# Patient Record
Sex: Female | Born: 1995 | Race: White | Hispanic: No | Marital: Single | Smoking: Former smoker
Health system: Southern US, Community
[De-identification: ages and names within clinical notes are randomized; demographics above are authoritative.]

## PROBLEM LIST (undated history)

## (undated) DIAGNOSIS — R519 Headache, unspecified: Secondary | ICD-10-CM

## (undated) DIAGNOSIS — U071 COVID-19: Secondary | ICD-10-CM

## (undated) DIAGNOSIS — N926 Irregular menstruation, unspecified: Secondary | ICD-10-CM

## (undated) DIAGNOSIS — R011 Cardiac murmur, unspecified: Secondary | ICD-10-CM

## (undated) DIAGNOSIS — Z8759 Personal history of other complications of pregnancy, childbirth and the puerperium: Secondary | ICD-10-CM

## (undated) DIAGNOSIS — R51 Headache: Secondary | ICD-10-CM

## (undated) HISTORY — DX: Headache, unspecified: R51.9

## (undated) HISTORY — DX: Irregular menstruation, unspecified: N92.6

## (undated) HISTORY — PX: WISDOM TOOTH EXTRACTION: SHX21

## (undated) HISTORY — DX: Headache: R51

## (undated) HISTORY — PX: OTHER SURGICAL HISTORY: SHX169

---
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
## 2016-10-09 NOTE — L&D Delivery Note (Signed)
Delivery Note   Tracy AntuRachel M Krinsky is a 21 y.o. G2P0010 at 5359w2d Estimated Date of Delivery: 05/16/17  PRE-OPERATIVE DIAGNOSIS:  1) 459w2d pregnancy.   POST-OPERATIVE DIAGNOSIS:  1) 4559w2d pregnancy s/p Vaginal, Spontaneous Delivery   Delivery Type: Vaginal, Spontaneous Delivery    Delivery Clinician: Doreene BurkeHOMPSON, Thelmer Legler   Delivery Anesthesia: None   Labor Complications:   PPROM   Additional complications: none  ESTIMATED BLOOD LOSS: 200 ml    FINDINGS:   1) female infant, Apgar scores of     at 1 minute and     at 5 minutes and a birthweight of    ounces.    2) Nuchal cord: no  SPECIMENS:   PLACENTA:   Appearance: Intact    Removal: Spontaneous      Disposition:   delivered intact, 3 vessel cord. Cord blood collected. Held per protocol then   Discarded.   DISPOSITION:  Infant to transported to Neonatal ICU  NARRATIVE SUMMARY: Labor course:  Ms. Tracy AntuRachel M [name] is a G2P0010 at 3559w2d who presented for PPROM on 03/09/17 and labor management.  She progressed well in labor without pitocin.  She received  no anesthesia. She used nitrous oxide for pain management and proceeded to complete dilation. She evidenced good maternal expulsive effort during the second stage. She went on to deliver a viable female infant. The placenta delivered without problems and was noted to be complete. A perineal and vaginal examination was performed. Episiotomy/Lacerations: none, periurethral skid marks bilaterally, hemostatic. Not sutured  The patient tolerated this well.   Doreene Burkennie Jaylyn Iyer, CNM 03/16/2017 8:28 PM

## 2016-10-25 ENCOUNTER — Encounter: Payer: Self-pay | Admitting: Emergency Medicine

## 2016-10-25 ENCOUNTER — Emergency Department
Admission: EM | Admit: 2016-10-25 | Discharge: 2016-10-25 | Disposition: A | Discharge status: Home / Self Care | Payer: Medicaid Other | Attending: Emergency Medicine | Admitting: Emergency Medicine

## 2016-10-25 DIAGNOSIS — Z87891 Personal history of nicotine dependence: Secondary | ICD-10-CM | POA: Insufficient documentation

## 2016-10-25 DIAGNOSIS — O219 Vomiting of pregnancy, unspecified: Secondary | ICD-10-CM | POA: Insufficient documentation

## 2016-10-25 DIAGNOSIS — Z3A11 11 weeks gestation of pregnancy: Secondary | ICD-10-CM | POA: Insufficient documentation

## 2016-10-25 HISTORY — DX: Personal history of other complications of pregnancy, childbirth and the puerperium: Z87.59

## 2016-10-25 HISTORY — DX: Cardiac murmur, unspecified: R01.1

## 2016-10-25 LAB — COMPREHENSIVE METABOLIC PANEL
ALT: 11 U/L — ABNORMAL LOW (ref 14–54)
AST: 22 U/L (ref 15–41)
Albumin: 4.4 g/dL (ref 3.5–5.0)
Alkaline Phosphatase: 49 U/L (ref 38–126)
Anion gap: 9 (ref 5–15)
BUN: 8 mg/dL (ref 6–20)
CO2: 23 mmol/L (ref 22–32)
Calcium: 10 mg/dL (ref 8.9–10.3)
Chloride: 105 mmol/L (ref 101–111)
Creatinine, Ser: 0.47 mg/dL (ref 0.44–1.00)
GFR calc Af Amer: >60 mL/min (ref >60)
GFR calc non Af Amer: >60 mL/min (ref >60)
Glucose, Bld: 103 mg/dL — ABNORMAL HIGH (ref 65–99)
Potassium: 3.4 mmol/L — ABNORMAL LOW (ref 3.5–5.1)
Sodium: 137 mmol/L (ref 135–145)
Total Bilirubin: 1 mg/dL (ref 0.3–1.2)
Total Protein: 8 g/dL (ref 6.5–8.1)

## 2016-10-25 LAB — URINALYSIS, COMPLETE (UACMP) WITH MICROSCOPIC
Bilirubin Urine: NEGATIVE
Glucose, UA: NEGATIVE mg/dL
Ketones, ur: 20 mg/dL — AB
Nitrite: NEGATIVE
Protein, ur: 30 mg/dL — AB
Specific Gravity, Urine: 1.028 (ref 1.005–1.030)
pH: 6 (ref 5.0–8.0)

## 2016-10-25 LAB — CBC
HCT: 35.4 % (ref 35.0–47.0)
Hemoglobin: 12.7 g/dL (ref 12.0–16.0)
MCH: 30.8 pg (ref 26.0–34.0)
MCHC: 36 g/dL (ref 32.0–36.0)
MCV: 85.6 fL (ref 80.0–100.0)
Platelets: 309 10*3/uL (ref 150–440)
RBC: 4.14 MIL/uL (ref 3.80–5.20)
RDW: 12.8 % (ref 11.5–14.5)
WBC: 12.3 10*3/uL — ABNORMAL HIGH (ref 3.6–11.0)

## 2016-10-25 LAB — HCG, QUANTITATIVE, PREGNANCY: hCG, Beta Chain, Quant, S: 140158 m[IU]/mL — ABNORMAL HIGH (ref <5)

## 2016-10-25 LAB — LIPASE, BLOOD: Lipase: 18 U/L (ref 11–51)

## 2016-10-25 MED ORDER — POTASSIUM CHLORIDE CRYS ER 20 MEQ PO TBCR
10.0000 meq | EXTENDED_RELEASE_TABLET | Freq: Once | ORAL | Status: AC
  Administered 2016-10-25: 10 meq via ORAL
  Filled 2016-10-25: qty 1

## 2016-10-25 MED ORDER — SODIUM CHLORIDE 0.9 % IV BOLUS (SEPSIS)
1000.0000 mL | Freq: Once | INTRAVENOUS | Status: AC
  Administered 2016-10-25: 1000 mL via INTRAVENOUS

## 2016-10-25 MED ORDER — METOCLOPRAMIDE HCL 5 MG/ML IJ SOLN
10.0000 mg | Freq: Once | INTRAMUSCULAR | Status: AC
Start: 2016-10-25 — End: 2016-10-25
  Administered 2016-10-25: 10 mg via INTRAVENOUS
  Filled 2016-10-25: qty 2

## 2016-10-25 MED ORDER — METOCLOPRAMIDE HCL 10 MG PO TABS: 10.0000 mg | ORAL_TABLET | Freq: Three times a day (TID) | ORAL | 0 refills | Status: DC | PRN

## 2016-10-25 MED ORDER — METOCLOPRAMIDE HCL 10 MG PO TABS: 10.0000 mg | ORAL_TABLET | Freq: Three times a day (TID) | ORAL | 1 refills | Status: DC

## 2016-10-25 NOTE — ED Notes (Signed)
Pt reports [redacted] weeks pregnant and has been vomiting daily for the last 2 weeks - worse over the past few days - reported vomiting 9 times in the last 24 hours and unable to hold any fluids down - no other complaints

## 2016-10-25 NOTE — ED Notes (Signed)
Pt unable to provide urine specimen at this time

## 2016-10-25 NOTE — Discharge Instructions (Signed)
Please seek medical attention for any high fevers, chest pain, shortness of breath, change in behavior, persistent vomiting, bloody stool or any other new or concerning symptoms.  

## 2016-10-25 NOTE — ED Provider Notes (Signed)
[facility] Emergency Department Provider Note   ____________________________________________   I have reviewed the triage vital signs and the nursing notes.   HISTORY  Chief Complaint Emesis   History limited by: Not Limited   HPI Tracy [name] is a 21 y.o. female who presents to the emergency department today because of concerns for nausea vomiting and dehydration in the setting of pregnancy. Patient weighs she is about [redacted] weeks pregnant. This is by dates. Patient has not seen an OB/GYN yet for this pregnancy. Patient states however for the past few weeks she has been unable to tolerate anything by mouth. She states that she becomes nauseous and vomits. She denies any abdominal pain with this. She denies any diarrhea or fevers. No shortness of breath.   Past Medical History:  Diagnosis Date  . Abortion history   . Heart murmur     There are no active problems to display for this patient.   History reviewed. No pertinent surgical history.  Prior to Admission medications   Not on File    Allergies Patient has no known allergies.  No family history on file.  Social History Social History  Substance Use Topics  . Smoking status: Former Games developer  . Smokeless tobacco: Never Used  . Alcohol use No    Review of Systems  Constitutional: Negative for fever. Cardiovascular: Negative for chest pain. Respiratory: Negative for shortness of breath. Gastrointestinal: Negative for abdominal pain, vomiting and diarrhea. Neurological: Negative for headaches, focal weakness or numbness.  10-point ROS otherwise negative.  ____________________________________________   PHYSICAL EXAM:  VITAL SIGNS: ED Triage Vitals [10/25/16 1915]  Enc Vitals Group     BP 119/74     Pulse Rate (!) 107     Resp 18     Temp 98.5 F (36.9 C)     Temp Source Oral     SpO2 99 %     Weight 130 lb (59 kg)     Height 5\' 1"  (1.549 m)     Head Circumference    Peak Flow      Pain Score 3   Constitutional: Alert and oriented. Well appearing and in no distress. Eyes: Conjunctivae are normal. Normal extraocular movements. ENT   Head: Normocephalic and atraumatic.   Nose: No congestion/rhinnorhea.   Mouth/Throat: Mucous membranes are moist.   Neck: No stridor. Hematological/Lymphatic/Immunilogical: No cervical lymphadenopathy. Cardiovascular: Normal rate, regular rhythm.  No murmurs, rubs, or gallops.  Respiratory: Normal respiratory effort without tachypnea nor retractions. Breath sounds are clear and equal bilaterally. No wheezes/rales/rhonchi. Gastrointestinal: Soft and non tender. No rebound. No guarding.  Genitourinary: Deferred Musculoskeletal: Normal range of motion in all extremities. No lower extremity edema. Neurologic:  Normal speech and language. No gross focal neurologic deficits are appreciated.  Skin:  Skin is warm, dry and intact. No rash noted. Psychiatric: Mood and affect are normal. Speech and behavior are normal. Patient exhibits appropriate insight and judgment.  ____________________________________________    LABS (pertinent positives/negatives)  Labs Reviewed  COMPREHENSIVE METABOLIC PANEL - Abnormal; Notable for the following:       Result Value   Potassium 3.4 (*)    Glucose, Bld 103 (*)    ALT 11 (*)    All other components within normal limits  CBC - Abnormal; Notable for the following:    WBC 12.3 (*)    All other components within normal limits  URINALYSIS, COMPLETE (UACMP) WITH MICROSCOPIC - Abnormal; Notable for the following:  Color, Urine YELLOW (*)    APPearance CLEAR (*)    Hgb urine dipstick SMALL (*)    Ketones, ur 20 (*)    Protein, ur 30 (*)    Leukocytes, UA TRACE (*)    Bacteria, UA RARE (*)    Squamous Epithelial / LPF 0-5 (*)    All other components within normal limits  HCG, QUANTITATIVE, PREGNANCY - Abnormal; Notable for the following:    hCG, Beta Chain, Quant, S 140,158  (*)    All other components within normal limits  LIPASE, BLOOD     ____________________________________________   EKG  None  ____________________________________________    RADIOLOGY  None  ____________________________________________   PROCEDURES  Procedures  ____________________________________________   INITIAL IMPRESSION / ASSESSMENT AND PLAN / ED COURSE  Pertinent labs & imaging results that were available during my care of the patient were reviewed by me and considered in my medical decision making (see chart for details).  Patient presented to the emergency department today because of concerns for nausea and vomiting during pregnancy. Blood work showed a slightly decreased potassium. Patient was given IV fluids and antiemetics in the emergency department. Patient stated she did feel better after that. Bedside ultrasound did show a single intrauterine pregnancy. I did discuss with patient the importance of following up with OB/GYN. Will give patient OB/GYN follow-up information.  ____________________________________________   FINAL CLINICAL IMPRESSION(S) / ED DIAGNOSES  Final diagnoses:  Vomiting affecting pregnancy     Note: This dictation was prepared with Dragon dictation. Any transcriptional errors that result from this process are unintentional     Phineas SemenGraydon Xinyi Batton, MD 10/25/16 [id number]

## 2016-10-25 NOTE — ED Triage Notes (Signed)
Pt ambulatory to triage with steady gait with c/o nausea and vomiting x 6 weeks, reports is [redacted] weeks pregnant. Pt states symptoms have become worse. Pt has not been seen by OB yet. Pt alert and oriented x 4, respirations even and unlabored. Skin warm and dry. Pt also c/o headache.

## 2016-12-11 ENCOUNTER — Ambulatory Visit (INDEPENDENT_AMBULATORY_CARE_PROVIDER_SITE_OTHER): Payer: Self-pay

## 2016-12-11 ENCOUNTER — Other Ambulatory Visit: Payer: Self-pay | Admitting: Certified Nurse Midwife

## 2016-12-11 ENCOUNTER — Ambulatory Visit (INDEPENDENT_AMBULATORY_CARE_PROVIDER_SITE_OTHER): Payer: Self-pay | Admitting: Certified Nurse Midwife

## 2016-12-11 VITALS — BP 94/67 | HR 96 | Ht 61.0 in | Wt 133.3 lb

## 2016-12-11 DIAGNOSIS — N926 Irregular menstruation, unspecified: Secondary | ICD-10-CM

## 2016-12-11 DIAGNOSIS — Z1389 Encounter for screening for other disorder: Secondary | ICD-10-CM

## 2016-12-11 DIAGNOSIS — Z3482 Encounter for supervision of other normal pregnancy, second trimester: Secondary | ICD-10-CM

## 2016-12-11 DIAGNOSIS — Z8489 Family history of other specified conditions: Secondary | ICD-10-CM

## 2016-12-11 DIAGNOSIS — Z113 Encounter for screening for infections with a predominantly sexual mode of transmission: Secondary | ICD-10-CM

## 2016-12-11 NOTE — Patient Instructions (Signed)

## 2016-12-11 NOTE — Progress Notes (Signed)
Tracy [name] presents for NOB nurse interview visit. Pregnancy confirmation done at ER on 10/25/2016, BHCG: 140,158 of which pt told them and did confirm she was 11 weeks. Pt was treated for dehydration, low potassium.  Today's visit pt EGA-17.5 wks.  G-2, P-0010.  Pregnancy education material explained and given. Has cats in the home. Pt states she does not change litter bo.  NOB labs ordered. HIV labs and Drug screen were explained optional and she did not decline. Drug screen ordered. PNV encouraged. Genetic screening options discussed. Genetic testing: Unsure. Pt may discuss with provider. Pt states she does have maternal family history of identical twins. Ultrasound needed for viability and dating. Ultrasound confirms EGA is correct. Pt has hx of irregular periods. Also has chronic h/a, and history of migraines of which she took Excedrin Migraine and now takes Tylenol.   Pt. To follow up with provider asap  for NOB physical. Has not received any prenatal care.   All questions answered.

## 2016-12-12 LAB — GC/CHLAMYDIA PROBE AMP
Chlamydia trachomatis, NAA: NEGATIVE
Neisseria gonorrhoeae by PCR: NEGATIVE

## 2016-12-13 ENCOUNTER — Encounter: Payer: Self-pay | Admitting: Certified Nurse Midwife

## 2016-12-13 ENCOUNTER — Ambulatory Visit (INDEPENDENT_AMBULATORY_CARE_PROVIDER_SITE_OTHER): Payer: Self-pay | Admitting: Certified Nurse Midwife

## 2016-12-13 VITALS — BP 108/80 | HR 98 | Wt 134.6 lb

## 2016-12-13 DIAGNOSIS — Z369 Encounter for antenatal screening, unspecified: Secondary | ICD-10-CM

## 2016-12-13 DIAGNOSIS — Z124 Encounter for screening for malignant neoplasm of cervix: Secondary | ICD-10-CM

## 2016-12-13 DIAGNOSIS — Z3482 Encounter for supervision of other normal pregnancy, second trimester: Secondary | ICD-10-CM

## 2016-12-13 DIAGNOSIS — O0932 Supervision of pregnancy with insufficient antenatal care, second trimester: Secondary | ICD-10-CM

## 2016-12-13 LAB — MICROSCOPIC EXAMINATION
Bacteria, UA: NONE SEEN
Casts: NONE SEEN /lpf

## 2016-12-13 LAB — POCT URINALYSIS DIPSTICK
Bilirubin, UA: NEGATIVE
Glucose, UA: NEGATIVE
Ketones, UA: NEGATIVE
Leukocytes, UA: NEGATIVE
Nitrite, UA: NEGATIVE
Protein, UA: NEGATIVE
Spec Grav, UA: 1.025
Urobilinogen, UA: NEGATIVE
pH, UA: 6

## 2016-12-13 LAB — MONITOR DRUG PROFILE 14(MW)
Amphetamine Scrn, Ur: NEGATIVE ng/mL
BARBITURATE SCREEN URINE: NEGATIVE ng/mL
BENZODIAZEPINE SCREEN, URINE: NEGATIVE ng/mL
Buprenorphine, Urine: NEGATIVE ng/mL
CANNABINOIDS UR QL SCN: NEGATIVE ng/mL
Cocaine (Metab) Scrn, Ur: NEGATIVE ng/mL
Creatinine(Crt), U: 230.6 mg/dL (ref 20.0–300.0)
Fentanyl, Urine: NEGATIVE pg/mL
Meperidine Screen, Urine: NEGATIVE ng/mL
Methadone Screen, Urine: NEGATIVE ng/mL
OXYCODONE+OXYMORPHONE UR QL SCN: NEGATIVE ng/mL
Opiate Scrn, Ur: NEGATIVE ng/mL
Ph of Urine: 5.6 (ref 4.5–8.9)
Phencyclidine Qn, Ur: NEGATIVE ng/mL
Propoxyphene Scrn, Ur: NEGATIVE ng/mL
SPECIFIC GRAVITY: 1.024
Tramadol Screen, Urine: NEGATIVE ng/mL

## 2016-12-13 LAB — HEPATITIS B SURFACE ANTIGEN: Hepatitis B Surface Ag: NEGATIVE

## 2016-12-13 LAB — HIV ANTIBODY (ROUTINE TESTING W REFLEX): HIV Screen 4th Generation wRfx: NONREACTIVE

## 2016-12-13 LAB — NICOTINE SCREEN, URINE: Cotinine Ql Scrn, Ur: NEGATIVE ng/mL

## 2016-12-13 LAB — URINALYSIS, ROUTINE W REFLEX MICROSCOPIC
Bilirubin, UA: NEGATIVE
Glucose, UA: NEGATIVE
Leukocytes, UA: NEGATIVE
Nitrite, UA: NEGATIVE
Specific Gravity, UA: 1.026 (ref 1.005–1.030)
Urobilinogen, Ur: 0.2 mg/dL (ref 0.2–1.0)
pH, UA: 5.5 (ref 5.0–7.5)

## 2016-12-13 LAB — CBC WITH DIFFERENTIAL/PLATELET
Basophils Absolute: 0 10*3/uL (ref 0.0–0.2)
Basos: 0 %
EOS (ABSOLUTE): 0 10*3/uL (ref 0.0–0.4)
Eos: 0 %
Hematocrit: 36.1 % (ref 34.0–46.6)
Hemoglobin: 12.3 g/dL (ref 11.1–15.9)
Immature Grans (Abs): 0 10*3/uL (ref 0.0–0.1)
Immature Granulocytes: 0 %
Lymphocytes Absolute: 1.6 10*3/uL (ref 0.7–3.1)
Lymphs: 16 %
MCH: 29.4 pg (ref 26.6–33.0)
MCHC: 34.1 g/dL (ref 31.5–35.7)
MCV: 86 fL (ref 79–97)
Monocytes Absolute: 0.3 10*3/uL (ref 0.1–0.9)
Monocytes: 3 %
Neutrophils Absolute: 8.2 10*3/uL — ABNORMAL HIGH (ref 1.4–7.0)
Neutrophils: 81 %
Platelets: 320 10*3/uL (ref 150–379)
RBC: 4.19 x10E6/uL (ref 3.77–5.28)
RDW: 13.7 % (ref 12.3–15.4)
WBC: 10.2 10*3/uL (ref 3.4–10.8)

## 2016-12-13 LAB — VARICELLA ZOSTER ANTIBODY, IGG: Varicella zoster IgG: <135 index — ABNORMAL LOW (ref >165)

## 2016-12-13 LAB — URINE CULTURE, OB REFLEX

## 2016-12-13 LAB — ABO

## 2016-12-13 LAB — CULTURE, OB URINE

## 2016-12-13 LAB — RH TYPE: Rh Factor: POSITIVE

## 2016-12-13 LAB — RPR: RPR Ser Ql: NONREACTIVE

## 2016-12-13 LAB — ANTIBODY SCREEN: Antibody Screen: NEGATIVE

## 2016-12-13 LAB — RUBELLA SCREEN: Rubella Antibodies, IGG: <0.9 index — ABNORMAL LOW (ref >0.99)

## 2016-12-13 NOTE — Progress Notes (Signed)
NEW OB HISTORY AND PHYSICAL  SUBJECTIVE:       Tracy [name] is a 21 y.o. G2P0010 female, Patient's last menstrual period was 08/09/2016., Estimated Date of Delivery: 05/16/17, 7120w0d, presents today for establishment of Prenatal Care. She has no unusual complaints     Gynecologic History Patient's last menstrual period was 08/09/2016. Normal Contraception: OCP (estrogen/progesterone) Last Pap: never had. Collected today  Obstetric History OB History  Gravida Para Term Preterm AB Living  2       1    SAB TAB Ectopic Multiple Live Births    1          # Outcome Date GA Lbr Len/2nd Weight Sex Delivery Anes PTL Lv  2 Current           1 TAB 2016        ND      Past Medical History:  Diagnosis Date  . Abortion history   . Headache    chronic and migraines  . Heart murmur   . Irregular periods    history    Past Surgical History:  Procedure Laterality Date  . adnoids    . WISDOM TOOTH EXTRACTION      Current Outpatient Prescriptions on File Prior to Visit  Medication Sig Dispense Refill  . metoCLOPramide (REGLAN) 10 MG tablet Take 1 tablet (10 mg total) by mouth every 8 (eight) hours as needed for nausea or vomiting. (Patient not taking: Reported on 12/11/2016) 20 tablet 0  . Prenatal Vit-Fe Fumarate-FA (PRENATAL 1+1 PO) Take 1 tablet by mouth daily.     No current facility-administered medications on file prior to visit.     No Known Allergies  Social History   Social History  . Marital status: Single    Spouse name: N/A  . Number of children: N/A  . Years of education: N/A   Occupational History  . Not on file.   Social History Main Topics  . Smoking status: Former Games developermoker  . Smokeless tobacco: Never Used  . Alcohol use No  . Drug use: No  . Sexual activity: Yes    Birth control/ protection: None   Other Topics Concern  . Not on file   Social History Narrative  . No narrative on file    Family History  Problem Relation Age of Onset  .  Hypertension Maternal Grandmother     The following portions of the patient's history were reviewed and updated as appropriate: allergies, current medications, past OB history, past medical history, past surgical history, past family history, past social history, and problem list.  OBJECTIVE: Initial Physical Exam (New OB)  GENERAL APPEARANCE: alert, well appearing, in no apparent distress, oriented to person, place and time HEAD: normocephalic, atraumatic MOUTH: mucous membranes moist, pharynx normal without lesions THYROID: no thyromegaly or masses present BREASTS: no masses noted, no significant tenderness, no palpable axillary nodes, no skin changes LUNGS: clear to auscultation, no wheezes, rales or rhonchi, symmetric air entry HEART: regular rate and rhythm, no murmurs ABDOMEN: soft, nontender, nondistended, no abnormal masses, no epigastric pain and FHT present @ 158 EXTREMITIES: no redness or tenderness in the calves or thighs, no edema SKIN: normal coloration and turgor, no rashes LYMPH NODES: no adenopathy palpable NEUROLOGIC: alert, oriented, normal speech, no focal findings or movement disorder noted  PELVIC EXAM EXTERNAL GENITALIA: normal appearing vulva with no masses, tenderness or lesions VAGINA: no abnormal discharge or lesions CERVIX: no cervical motion tenderness. nabothian cysts present UTERUS: gravid  and consistent with 18 weeks ADNEXA: no masses palpable and nontender OB EXAM PELVIMETRY: appears adequate  ASSESSMENT: Normal pregnancy Late entry to prenatal care  PLAN: Prenatal care schedule reviewed. New OB counseling: The patient has been given an overview regarding routine prenatal care. Recommendations regarding diet, weight gain, medications, and exercise in pregnancy were given. Prenatal testing, optional genetic testing reviewed. Fleet Contras requesting MaterniT, completed at today's visit.  20 wks anatomy u/s scheduled. Benefits of Breast Feeding were  discussed. The patient is encouraged to consider nursing her baby post partum. Preterm labor precautions reviewed.Will notify with genetic and pap results. ROB in 4 wks.   Doreene Burke, CNM

## 2016-12-13 NOTE — Patient Instructions (Signed)

## 2016-12-14 ENCOUNTER — Other Ambulatory Visit: Payer: Self-pay

## 2016-12-14 NOTE — Addendum Note (Signed)
Addended by: Rosine BeatLONTZ, Damaso Laday L on: 12/14/2016 02:54 PM   Modules accepted: Orders

## 2016-12-15 ENCOUNTER — Other Ambulatory Visit: Payer: Self-pay | Admitting: Certified Nurse Midwife

## 2016-12-15 DIAGNOSIS — Z369 Encounter for antenatal screening, unspecified: Secondary | ICD-10-CM

## 2016-12-18 ENCOUNTER — Other Ambulatory Visit: Payer: Self-pay

## 2016-12-18 LAB — CYTOLOGY - PAP

## 2016-12-19 ENCOUNTER — Other Ambulatory Visit: Payer: Self-pay | Admitting: Certified Nurse Midwife

## 2016-12-19 DIAGNOSIS — Z369 Encounter for antenatal screening, unspecified: Secondary | ICD-10-CM

## 2016-12-20 ENCOUNTER — Ambulatory Visit (INDEPENDENT_AMBULATORY_CARE_PROVIDER_SITE_OTHER): Payer: Self-pay

## 2016-12-20 DIAGNOSIS — Z369 Encounter for antenatal screening, unspecified: Secondary | ICD-10-CM

## 2016-12-22 ENCOUNTER — Other Ambulatory Visit: Payer: Self-pay | Admitting: Certified Nurse Midwife

## 2016-12-22 DIAGNOSIS — IMO0002 Reserved for concepts with insufficient information to code with codable children: Secondary | ICD-10-CM

## 2016-12-22 DIAGNOSIS — Z0489 Encounter for examination and observation for other specified reasons: Secondary | ICD-10-CM

## 2016-12-28 ENCOUNTER — Ambulatory Visit (INDEPENDENT_AMBULATORY_CARE_PROVIDER_SITE_OTHER): Payer: Self-pay

## 2016-12-28 DIAGNOSIS — IMO0002 Reserved for concepts with insufficient information to code with codable children: Secondary | ICD-10-CM

## 2016-12-28 DIAGNOSIS — Z362 Encounter for other antenatal screening follow-up: Secondary | ICD-10-CM

## 2016-12-28 DIAGNOSIS — Z0489 Encounter for examination and observation for other specified reasons: Secondary | ICD-10-CM

## 2017-01-03 LAB — MATERNIT 21 PLUS CORE, BLOOD

## 2017-02-21 ENCOUNTER — Ambulatory Visit (INDEPENDENT_AMBULATORY_CARE_PROVIDER_SITE_OTHER): Payer: Medicaid Other | Admitting: Certified Nurse Midwife

## 2017-02-21 VITALS — BP 94/65 | HR 103 | Wt 133.9 lb

## 2017-02-21 DIAGNOSIS — Z3482 Encounter for supervision of other normal pregnancy, second trimester: Secondary | ICD-10-CM

## 2017-02-21 DIAGNOSIS — Z131 Encounter for screening for diabetes mellitus: Secondary | ICD-10-CM

## 2017-02-21 DIAGNOSIS — Z130 Encounter for screening for diseases of the blood and blood-forming organs and certain disorders involving the immune mechanism: Secondary | ICD-10-CM

## 2017-02-21 DIAGNOSIS — Z23 Encounter for immunization: Secondary | ICD-10-CM

## 2017-02-21 LAB — POCT URINALYSIS DIPSTICK
Bilirubin, UA: NEGATIVE
Blood, UA: NEGATIVE
Glucose, UA: NEGATIVE
Ketones, UA: NEGATIVE
Nitrite, UA: NEGATIVE
Protein, UA: NEGATIVE
Spec Grav, UA: 1.015 (ref 1.010–1.025)
Urobilinogen, UA: 0.2 E.U./dL
pH, UA: 6.5 (ref 5.0–8.0)

## 2017-02-21 NOTE — Patient Instructions (Addendum)
Glucose Tolerance Test During Pregnancy The glucose tolerance test (GTT) is a blood test used to determine if you have developed a type of diabetes during pregnancy (gestational diabetes). This is when your body does not properly process sugar (glucose) in the food you eat, resulting in high blood glucose levels. Typically, a GTT is done after you have had a 1-hour glucose test with results that indicate you possibly have gestational diabetes. It may also be done if:  You have a history of giving birth to very large babies or have experienced repeated fetal loss (stillbirth).  You have signs and symptoms of diabetes, such as:  Changes in your vision.  Tingling or numbness in your hands or feet.  Changes in hunger, thirst, and urination not otherwise explained by your pregnancy. The GTT lasts about 3 hours. You will be given a sugar-water solution to drink at the beginning of the test. You will have blood drawn before you drink the solution and then again 1, 2, and 3 hours after you drink it. You will not be allowed to eat or drink anything else during the test. You must remain at the testing location to make sure that your blood is drawn on time. You should also avoid exercising during the test, because exercise can alter test results. How do I prepare for this test? Eat normally for 3 days prior to the GTT test, including having plenty of carbohydrate-rich foods. Do not eat or drink anything except water during the final 12 hours before the test. In addition, your health care provider may ask you to stop taking certain medicines before the test. What do the results mean? It is your responsibility to obtain your test results. Ask the lab or department performing the test when and how you will get your results. Contact your health care provider to discuss any questions you have about your results. Range of Normal Values  Ranges for normal values may vary among different labs and hospitals. You  should always check with your health care provider after having lab work or other tests done to discuss whether your values are considered within normal limits. Normal levels of blood glucose are as follows:  Fasting: less than 105 mg/dL.  1 hour after drinking the solution: less than 190 mg/dL.  2 hours after drinking the solution: less than 165 mg/dL.  3 hours after drinking the solution: less than 145 mg/dL. Some substances can interfere with GTT results. These may include:  Blood pressure and heart failure medicines, including beta blockers, furosemide, and thiazides.  Anti-inflammatory medicines, including aspirin.  Nicotine.  Some psychiatric medicines. Meaning of Results Outside Normal Value Ranges  GTT test results that are above normal values may indicate a number of health problems, such as:  Gestational diabetes.  Acute stress response.  Cushing syndrome.  Tumors such as pheochromocytoma or glucagonoma.  Long-term kidney problems.  Pancreatitis.  Hyperthyroidism.  Current infection. Discuss your test results with your health care provider. He or she will use the results to make a diagnosis and determine a treatment plan that is right for you. This information is not intended to replace advice given to you by your health care provider. Make sure you discuss any questions you have with your health care provider. Document Released: 03/26/2012 Document Revised: 03/02/2016 Document Reviewed: 01/30/2014 Elsevier Interactive Patient Education  2017 [employer]   Getting the whooping cough vaccine while pregnant is better than getting the vaccine after you give birth Whooping cough vaccination during  pregnancy is ideal so your baby will have short-term protection as soon as he is born. This early protection is important because your baby will not start getting his whooping cough vaccines until he is 2 months old. These first few months of life are when your baby is  at greatest risk for catching whooping cough. This is also when he's at greatest risk for having severe, potentially life-threating complications from the infection. To avoid that gap in protection, it is best to get a whooping cough vaccine during pregnancy. You will then pass protection to your baby before he is born. To continue protecting your baby, he should get whooping cough vaccines starting at 2 months old. You may never have gotten the Tdap vaccine before and did not get it during this pregnancy. If so, you should make sure to get the vaccine immediately after you give birth, before leaving the hospital or birthing center. It will take about 2 weeks before your body develops protection (antibodies) in response to the vaccine. Once you have protection from the vaccine, you are less likely to give whooping cough to your newborn while caring for him. But remember, your baby will still be at risk for catching whooping cough from others. A recent study looked to see how effective Tdap was at preventing whooping cough in babies whose mothers got the vaccine while pregnant or in the hospital after giving birth. The study found that getting Tdap between 27 through 36 weeks of pregnancy is 85% more effective at preventing whooping cough in babies younger than 2 months old. Blood tests cannot tell if you need a whooping cough vaccine There are no blood tests that can tell you if you have enough antibodies in your body to protect yourself or your baby against whooping cough. Even if you have been sick with whooping cough in the past or previously received the vaccine, you still should get the vaccine during each pregnancy. Breastfeeding may pass some protective antibodies onto your baby By breastfeeding, you may pass some antibodies you have made in response to the vaccine to your baby. When you get a whooping cough vaccine during your pregnancy, you will have antibodies in your breast milk that you can share  with your baby as soon as your milk comes in. However, your baby will not get protective antibodies immediately if you wait to get the whooping cough vaccine until after delivering your baby. This is because it takes about 2 weeks for your body to create antibodies. Learn more about the health benefits of breastfeeding.

## 2017-02-21 NOTE — Progress Notes (Signed)
ROB, GTT, CBC, and blood consent today. Had anatomy scan on 12/20/16 ( see below) was incomplete has not returned for follow up. Encouraged her to return to complete anatomy scan. Discussed T Dap and is declining today but will research.  Asked whey she has not return, she stated that she did not check out when she left last time due to long line and assumed someone would call her for her next appointment. Explained that she must check out prior to leaving to schedule next appointment. She verbalizes understanding. She denies LOF, Vag bleeding , and contractions. Reviewed round ligament pain and encouraged use of belly band.  Follow up in 2 wks.   Doreene BurkeAnnie Declin Rajan,, CNM   ULTRASOUND REPORT  Location: [facility] Date of Service: 12/20/16  Indications: Anatomy Findings:  Singleton intrauterine pregnancy is visualized with FHR at 150 BPM. Biometrics give an (U/S) Gestational age of [redacted] weeks and an (U/S) EDD of 05/14/17; this correlates with the clinically established EDD of 05/16/17.  Fetal presentation is variable (vertex at beginning, breech at end), spine posterior.  EFW: 277 g ( 0 lbs. 10 oz. ). Placenta: Posterior, grade 0 and remote to cervix at 4.7 cm. AFI: Subjectively adequate with an MVP of 4.6 cm.  Anatomic survey is incomplete due to fetal position. Anatomy seen today appears WNL. Anatomy still needed to complete exam: all spine views, kidneys and aortic arch. Gender - Female.   Right Ovary measures 2.5 x 1.4 x 1.3 cm, and appears WNL. Left Ovary was not visualized. There is no evidence of a corpus luteal cyst seen. Survey of the adnexa demonstrates no adnexal masses. There is no free peritoneal fluid in the cul de sac.  Impression: 1. 19 week 2 day Viable Singleton Intrauterine pregnancy by U/S. 2. (U/S) EDD is consistent with Clinically established (LMP) EDD of 05/16/17. 3. Incomplete anatomy scan due to fetal position.  Recommendations: 1.Clinical correlation  with the patient's History and Physical Exam. 2. Patient will return in 1-2 weeks for follow up anatomy scan.  Revonda Humphreyeresa Elliott, RDMS, RVT Herold HarmsMartin A Defrancesco, MD

## 2017-02-22 ENCOUNTER — Encounter: Payer: Self-pay | Admitting: Certified Nurse Midwife

## 2017-02-22 LAB — HEMOGLOBIN AND HEMATOCRIT, BLOOD
Hematocrit: 33.1 % — ABNORMAL LOW (ref 34.0–46.6)
Hemoglobin: 10.7 g/dL — ABNORMAL LOW (ref 11.1–15.9)

## 2017-02-22 LAB — GLUCOSE, 1 HOUR GESTATIONAL: Gestational Diabetes Screen: 113 mg/dL (ref 65–139)

## 2017-03-07 ENCOUNTER — Other Ambulatory Visit: Payer: Self-pay | Admitting: Certified Nurse Midwife

## 2017-03-07 DIAGNOSIS — IMO0002 Reserved for concepts with insufficient information to code with codable children: Secondary | ICD-10-CM

## 2017-03-07 DIAGNOSIS — Z0489 Encounter for examination and observation for other specified reasons: Secondary | ICD-10-CM

## 2017-03-08 ENCOUNTER — Ambulatory Visit (INDEPENDENT_AMBULATORY_CARE_PROVIDER_SITE_OTHER): Payer: Medicaid Other | Admitting: Certified Nurse Midwife

## 2017-03-08 ENCOUNTER — Ambulatory Visit (INDEPENDENT_AMBULATORY_CARE_PROVIDER_SITE_OTHER): Payer: Medicaid Other

## 2017-03-08 VITALS — BP 93/61 | HR 75 | Wt 135.2 lb

## 2017-03-08 DIAGNOSIS — Z3483 Encounter for supervision of other normal pregnancy, third trimester: Secondary | ICD-10-CM | POA: Diagnosis not present

## 2017-03-08 DIAGNOSIS — Z048 Encounter for examination and observation for other specified reasons: Secondary | ICD-10-CM | POA: Diagnosis not present

## 2017-03-08 DIAGNOSIS — Z23 Encounter for immunization: Secondary | ICD-10-CM | POA: Diagnosis not present

## 2017-03-08 DIAGNOSIS — Z0489 Encounter for examination and observation for other specified reasons: Secondary | ICD-10-CM

## 2017-03-08 DIAGNOSIS — IMO0002 Reserved for concepts with insufficient information to code with codable children: Secondary | ICD-10-CM

## 2017-03-08 LAB — POCT URINALYSIS DIPSTICK
Bilirubin, UA: NEGATIVE
Blood, UA: NEGATIVE
Glucose, UA: NEGATIVE
Ketones, UA: NEGATIVE
Nitrite, UA: NEGATIVE
Protein, UA: NEGATIVE
Spec Grav, UA: 1.02 (ref 1.010–1.025)
Urobilinogen, UA: 0.2 E.U./dL
pH, UA: 7 (ref 5.0–8.0)

## 2017-03-08 NOTE — Progress Notes (Signed)
ROB-Pt doing well. Follow up US today, anatomy complete; findings discussed with pt and significant other. Encouraged adequate hydration with 80-100 ounces of water daily. Reviewed red flag symptoms and when to call. RTC x 2 weeks for ROB.   ULTRASOUND REPORT  Location: [facility] Date of Service: 03/08/17  Indications:F/U Anatomy Findings:  Mason JimSingleton intrauterine pregnancy is visualized with FHR at 152 BPM.  Fetal presentation is Vertex. Placenta: Posterior fundal, grade 0, remote to cervix. AFI: 12.4 cm.  Anatomic survey is complete and normal with the acquisition of the fetal spine, aortic arch and fetal kidneys; Gender - female  .  All visualized anatomy is normal in appearance.  Stomach and bladder are visualized.   Impression: 1. Complete and Normal FU Anatomy Scan.  Recommendations: 1.Clinical correlation with the patient's History and Physical Exam.

## 2017-03-08 NOTE — Patient Instructions (Signed)
How a Baby Grows During Pregnancy Pregnancy begins when a female's sperm enters a female's egg (fertilization). This happens in one of the tubes (fallopian tubes) that connect the ovaries to the womb (uterus). The fertilized egg is called an embryo until it reaches 10 weeks. From 10 weeks until birth, it is called a fetus. The fertilized egg moves down the fallopian tube to the uterus. Then it implants into the lining of the uterus and begins to grow. The developing fetus receives oxygen and nutrients through the pregnant woman's bloodstream and the tissues that grow (placenta) to support the fetus. The placenta is the life support system for the fetus. It provides nutrition and removes waste. Learning as much as you can about your pregnancy and how your baby is developing can help you enjoy the experience. It can also make you aware of when there might be a problem and when to ask questions. How long does a typical pregnancy last? A pregnancy usually lasts 280 days, or about 40 weeks. Pregnancy is divided into three trimesters:  First trimester: 0-13 weeks.  Second trimester: 14-27 weeks.  Third trimester: 28-40 weeks.  The day when your baby is considered ready to be born (full term) is your estimated date of delivery. How does my baby develop month by month? First month  The fertilized egg attaches to the inside of the uterus.  Some cells will form the placenta. Others will form the fetus.  The arms, legs, brain, spinal cord, lungs, and heart begin to develop.  At the end of the first month, the heart begins to beat.  Second month  The bones, inner ear, eyelids, hands, and feet form.  The genitals develop.  By the end of 8 weeks, all major organs are developing.  Third month  All of the internal organs are forming.  Teeth develop below the gums.  Bones and muscles begin to grow. The spine can flex.  The skin is transparent.  Fingernails and toenails begin to form.  Arms  and legs continue to grow longer, and hands and feet develop.  The fetus is about 3 in (7.6 cm) long.  Fourth month  The placenta is completely formed.  The external sex organs, neck, outer ear, eyebrows, eyelids, and fingernails are formed.  The fetus can hear, swallow, and move its arms and legs.  The kidneys begin to produce urine.  The skin is covered with a white waxy coating (vernix) and very fine hair (lanugo).  Fifth month  The fetus moves around more and can be felt for the first time (quickening).  The fetus starts to sleep and wake up and may begin to suck its finger.  The nails grow to the end of the fingers.  The organ in the digestive system that makes bile (gallbladder) functions and helps to digest the nutrients.  If your baby is a girl, eggs are present in her ovaries. If your baby is a boy, testicles start to move down into his scrotum.  Sixth month  The lungs are formed, but the fetus is not yet able to breathe.  The eyes open. The brain continues to develop.  Your baby has fingerprints and toe prints. Your baby's hair grows thicker.  At the end of the second trimester, the fetus is about 9 in (22.9 cm) long.  Seventh month  The fetus kicks and stretches.  The eyes are developed enough to sense changes in light.  The hands can make a grasping motion.  The   fetus responds to sound.  Eighth month  All organs and body systems are fully developed and functioning.  Bones harden and taste buds develop. The fetus may hiccup.  Certain areas of the brain are still developing. The skull remains soft.  Ninth month  The fetus gains about  lb (0.23 kg) each week.  The lungs are fully developed.  Patterns of sleep develop.  The fetus's head typically moves into a head-down position (vertex) in the uterus to prepare for birth. If the buttocks move into a vertex position instead, the baby is breech.  The fetus weighs 6-9 lbs (2.72-4.08 kg) and is  19-20 in (48.26-50.8 cm) long.  What can I do to have a healthy pregnancy and help my baby develop? Eating and Drinking  Eat a healthy diet. ? Talk with your health care provider to make sure that you are getting the nutrients that you and your baby need. ? Visit www.choosemyplate.gov to learn about creating a healthy diet.  Gain a healthy amount of weight during pregnancy as advised by your health care provider. This is usually 25-35 pounds. You may need to: ? Gain more if you were underweight before getting pregnant or if you are pregnant with more than one baby. ? Gain less if you were overweight or obese when you got pregnant.  Medicines and Vitamins  Take prenatal vitamins as directed by your health care provider. These include vitamins such as folic acid, iron, calcium, and vitamin D. They are important for healthy development.  Take medicines only as directed by your health care provider. Read labels and ask a pharmacist or your health care provider whether over-the-counter medicines, supplements, and prescription drugs are safe to take during pregnancy.  Activities  Be physically active as advised by your health care provider. Ask your health care provider to recommend activities that are safe for you to do, such as walking or swimming.  Do not participate in strenuous or extreme sports.  Lifestyle  Do not drink alcohol.  Do not use any tobacco products, including cigarettes, chewing tobacco, or electronic cigarettes. If you need help quitting, ask your health care provider.  Do not use illegal drugs.  Safety  Avoid exposure to mercury, lead, or other heavy metals. Ask your health care provider about common sources of these heavy metals.  Avoid listeria infection during pregnancy. Follow these precautions: ? Do not eat soft cheeses or deli meats. ? Do not eat hot dogs unless they have been warmed up to the point of steaming, such as in the microwave oven. ? Do not  drink unpasteurized milk.  Avoid toxoplasmosis infection during pregnancy. Follow these precautions: ? Do not change your cat's litter box, if you have a cat. Ask someone else to do this for you. ? Wear gardening gloves while working in the yard.  General Instructions  Keep all follow-up visits as directed by your health care provider. This is important. This includes prenatal care and screening tests.  Manage any chronic health conditions. Work closely with your health care provider to keep conditions, such as diabetes, under control.  How do I know if my baby is developing well? At each prenatal visit, your health care provider will do several different tests to check on your health and keep track of your baby's development. These include:  Fundal height. ? Your health care provider will measure your growing belly from top to bottom using a tape measure. ? Your health care provider will also feel your belly   to determine your baby's position.  Heartbeat. ? An ultrasound in the first trimester can confirm pregnancy and show a heartbeat, depending on how far along you are. ? Your health care provider will check your baby's heart rate at every prenatal visit. ? As you get closer to your delivery date, you may have regular fetal heart rate monitoring to make sure that your baby is not in distress.  Second trimester ultrasound. ? This ultrasound checks your baby's development. It also indicates your baby's gender.  What should I do if I have concerns about my baby's development? Always talk with your health care provider about any concerns that you may have. This information is not intended to replace advice given to you by your health care provider. Make sure you discuss any questions you have with your health care provider. Document Released: 03/13/2008 Document Revised: 03/02/2016 Document Reviewed: 03/04/2014 Elsevier Interactive Patient Education  2018 [employer]. DTaP Vaccine  (Diphtheria, Tetanus, and Pertussis): What You Need to Know 1. Why get vaccinated? Diphtheria, tetanus, and pertussis are serious diseases caused by bacteria. Diphtheria and pertussis are spread from person to person. Tetanus enters the body through cuts or wounds. DIPHTHERIA causes a thick covering in the back of the throat.  It can lead to breathing problems, paralysis, heart failure, and even death.  TETANUS (Lockjaw) causes painful tightening of the muscles, usually all over the body.  It can lead to "locking" of the jaw so the victim cannot open his mouth or swallow. Tetanus leads to death in up to 2 out of 10 cases.  PERTUSSIS (Whooping Cough) causes coughing spells so bad that it is hard for infants to eat, drink, or breathe. These spells can last for weeks.  It can lead to pneumonia, seizures (jerking and staring spells), brain damage, and death.  Diphtheria, tetanus, and pertussis vaccine (DTaP) can help prevent these diseases. Most children who are vaccinated with DTaP will be protected throughout childhood. Many more children would get these diseases if we stopped vaccinating. DTaP is a safer version of an older vaccine called DTP. DTP is no longer used in the [place]. 2. Who should get DTaP vaccine and when? Children should get 5 doses of DTaP vaccine, one dose at each of the following ages:  2 months  4 months  6 months  15-18 months  4-6 years  DTaP may be given at the same time as other vaccines. 3. Some children should not get DTaP vaccine or should wait  Children with minor illnesses, such as a cold, may be vaccinated. But children who are moderately or severely ill should usually wait until they recover before getting DTaP vaccine.  Any child who had a life-threatening allergic reaction after a dose of DTaP should not get another dose.  Any child who suffered a brain or nervous system disease within 7 days after a dose of DTaP should not get another  dose.  Talk with your doctor if your child: ? had a seizure or collapsed after a dose of DTaP, ? cried non-stop for 3 hours or more after a dose of DTaP, ? had a fever over 105F after a dose of DTaP. Ask your doctor for more information. Some of these children should not get another dose of pertussis vaccine, but may get a vaccine without pertussis, called DT. 4. Older children and adults DTaP is not licensed for adolescents, adults, or children 64 years of age and older. But older people still need protection. A  vaccine called Tdap is similar to DTaP. A single dose of Tdap is recommended for people 11 through 23 years of age. Another vaccine, called Td, protects against tetanus and diphtheria, but not pertussis. It is recommended every 10 years. There are separate Vaccine Information Statements for these vaccines. 5. What are the risks from DTaP vaccine? Getting diphtheria, tetanus, or pertussis disease is much riskier than getting DTaP vaccine. However, a vaccine, like any medicine, is capable of causing serious problems, such as severe allergic reactions. The risk of DTaP vaccine causing serious harm, or death, is extremely small. Mild problems (common)  Fever (up to about 1 child in 4)  Redness or swelling where the shot was given (up to about 1 child in 4)  Soreness or tenderness where the shot was given (up to about 1 child in 4) These problems occur more often after the 4th and 5th doses of the DTaP series than after earlier doses. Sometimes the 4th or 5th dose of DTaP vaccine is followed by swelling of the entire arm or leg in which the shot was given, lasting 1-7 days (up to about 1 child in 30). Other mild problems include:  Fussiness (up to about 1 child in 3)  Tiredness or poor appetite (up to about 1 child in 10)  Vomiting (up to about 1 child in 50) These problems generally occur 1-3 days after the shot. Moderate problems (uncommon)  Seizure (jerking or staring) (about 1  child out of 14,000)  Non-stop crying, for 3 hours or more (up to about 1 child out of 1,000)  High fever, over 105F (about 1 child out of 16,000) Severe problems (very rare)  Serious allergic reaction (less than 1 out of a million doses)  Several other severe problems have been reported after DTaP vaccine. These include: ? Long-term seizures, coma, or lowered consciousness ? Permanent brain damage. These are so rare it is hard to tell if they are caused by the vaccine. Controlling fever is especially important for children who have had seizures, for any reason. It is also important if another family member has had seizures. You can reduce fever and pain by giving your child an aspirin-free pain reliever when the shot is given, and for the next 24 hours, following the package instructions. 6. What if there is a serious reaction? What should I look for? Look for anything that concerns you, such as signs of a severe allergic reaction, very high fever, or behavior changes. Signs of a severe allergic reaction can include hives, swelling of the face and throat, difficulty breathing, a fast heartbeat, dizziness, and weakness. These would start a few minutes to a few hours after the vaccination. What should I do?  If you think it is a severe allergic reaction or other emergency that can't wait, call 9-1-1 or get the person to the nearest hospital. Otherwise, call your doctor.  Afterward, the reaction should be reported to the Vaccine Adverse Event Reporting System (VAERS). Your doctor might file this report, or you can do it yourself through the VAERS web site at www.vaers.LAgents.no, or by calling 1-9313233228. ? VAERS is only for reporting reactions. They do not give medical advice. 7. The National Vaccine Injury Compensation Program The [employer] Vaccine Injury Compensation Program (VICP) is a federal program that was created to compensate people who may have been injured by certain  vaccines. Persons who believe they may have been injured by a vaccine can learn about the program and about filing a  claim by calling 1-3105221884 or visiting the VICP website at SpiritualWord.atwww.hrsa.gov/vaccinecompensation. 8. How can I learn more?  Ask your doctor.  Call your local or state health department.  Contact the Centers for Disease Control and Prevention (CDC): ? Call (952)703-84311-917 244 5058 (1-800-CDC-INFO) or ? Visit CDC's website at PicCapture.uywww.cdc.gov/vaccines CDC DTaP Vaccine (Diphtheria, Tetanus, and Pertussis) VIS (02/22/06) This information is not intended to replace advice given to you by your health care provider. Make sure you discuss any questions you have with your health care provider. Document Released: 07/23/2006 Document Revised: 06/15/2016 Document Reviewed: 06/15/2016 Elsevier Interactive Patient Education  2017 [employer]

## 2017-03-09 ENCOUNTER — Inpatient Hospital Stay: Payer: Medicaid Other

## 2017-03-09 ENCOUNTER — Inpatient Hospital Stay
Admission: EM | Admit: 2017-03-09 | Discharge: 2017-03-18 | DRG: 775 | Disposition: A | Discharge status: Home / Self Care | Payer: Medicaid Other | Attending: Obstetrics and Gynecology | Admitting: Obstetrics and Gynecology

## 2017-03-09 DIAGNOSIS — Z3A30 30 weeks gestation of pregnancy: Secondary | ICD-10-CM

## 2017-03-09 DIAGNOSIS — Z3A31 31 weeks gestation of pregnancy: Secondary | ICD-10-CM | POA: Diagnosis not present

## 2017-03-09 DIAGNOSIS — O0933 Supervision of pregnancy with insufficient antenatal care, third trimester: Secondary | ICD-10-CM

## 2017-03-09 DIAGNOSIS — O26893 Other specified pregnancy related conditions, third trimester: Secondary | ICD-10-CM | POA: Diagnosis present

## 2017-03-09 DIAGNOSIS — Z6790 Unspecified blood type, Rh positive: Secondary | ICD-10-CM

## 2017-03-09 DIAGNOSIS — O42113 Preterm premature rupture of membranes, onset of labor more than 24 hours following rupture, third trimester: Secondary | ICD-10-CM

## 2017-03-09 DIAGNOSIS — O42013 Preterm premature rupture of membranes, onset of labor within 24 hours of rupture, third trimester: Secondary | ICD-10-CM | POA: Diagnosis present

## 2017-03-09 DIAGNOSIS — Z87891 Personal history of nicotine dependence: Secondary | ICD-10-CM

## 2017-03-09 DIAGNOSIS — R1030 Lower abdominal pain, unspecified: Secondary | ICD-10-CM

## 2017-03-09 DIAGNOSIS — O288 Other abnormal findings on antenatal screening of mother: Secondary | ICD-10-CM

## 2017-03-09 DIAGNOSIS — O0930 Supervision of pregnancy with insufficient antenatal care, unspecified trimester: Secondary | ICD-10-CM

## 2017-03-09 DIAGNOSIS — O0932 Supervision of pregnancy with insufficient antenatal care, second trimester: Secondary | ICD-10-CM

## 2017-03-09 DIAGNOSIS — O42919 Preterm premature rupture of membranes, unspecified as to length of time between rupture and onset of labor, unspecified trimester: Secondary | ICD-10-CM | POA: Diagnosis present

## 2017-03-09 LAB — URINALYSIS, COMPLETE (UACMP) WITH MICROSCOPIC
Bacteria, UA: NONE SEEN
Bilirubin Urine: NEGATIVE
Glucose, UA: NEGATIVE mg/dL
Ketones, ur: NEGATIVE mg/dL
Nitrite: NEGATIVE
Protein, ur: NEGATIVE mg/dL
Specific Gravity, Urine: 1.01 (ref 1.005–1.030)
pH: 7 (ref 5.0–8.0)

## 2017-03-09 LAB — CHLAMYDIA/NGC RT PCR (ARMC ONLY)
Chlamydia Tr: NOT DETECTED
N gonorrhoeae: NOT DETECTED

## 2017-03-09 LAB — WET PREP, GENITAL
Clue Cells Wet Prep HPF POC: NONE SEEN
Sperm: NONE SEEN
Trich, Wet Prep: NONE SEEN
Yeast Wet Prep HPF POC: NONE SEEN

## 2017-03-09 LAB — CBC
HCT: 33.2 % — ABNORMAL LOW (ref 35.0–47.0)
Hemoglobin: 11.6 g/dL — ABNORMAL LOW (ref 12.0–16.0)
MCH: 30.7 pg (ref 26.0–34.0)
MCHC: 34.9 g/dL (ref 32.0–36.0)
MCV: 88 fL (ref 80.0–100.0)
Platelets: 260 10*3/uL (ref 150–440)
RBC: 3.77 MIL/uL — ABNORMAL LOW (ref 3.80–5.20)
RDW: 14.3 % (ref 11.5–14.5)
WBC: 9.9 10*3/uL (ref 3.6–11.0)

## 2017-03-09 LAB — TYPE AND SCREEN
ABO/RH(D): O POS
Antibody Screen: NEGATIVE

## 2017-03-09 MED ORDER — SOD CITRATE-CITRIC ACID 500-334 MG/5ML PO SOLN
30.0000 mL | ORAL | Status: DC | PRN
  Filled 2017-03-09: qty 30

## 2017-03-09 MED ORDER — MAGNESIUM SULFATE BOLUS VIA INFUSION
6.0000 g | Freq: Once | INTRAVENOUS | Status: AC
  Administered 2017-03-09: 6 g via INTRAVENOUS
  Filled 2017-03-09: qty 500

## 2017-03-09 MED ORDER — BETAMETHASONE SOD PHOS & ACET 6 (3-3) MG/ML IJ SUSP
INTRAMUSCULAR | Status: AC
  Filled 2017-03-09: qty 1

## 2017-03-09 MED ORDER — OXYCODONE-ACETAMINOPHEN 5-325 MG PO TABS: 2.0000 | ORAL_TABLET | ORAL | Status: DC | PRN

## 2017-03-09 MED ORDER — OXYTOCIN 40 UNITS IN LACTATED RINGERS INFUSION - SIMPLE MED: 2.5000 [IU]/h | INTRAVENOUS | Status: DC

## 2017-03-09 MED ORDER — INDOMETHACIN 50 MG PO CAPS
50.0000 mg | ORAL_CAPSULE | Freq: Once | ORAL | Status: AC
  Administered 2017-03-09: 50 mg via ORAL
  Filled 2017-03-09: qty 1

## 2017-03-09 MED ORDER — TERBUTALINE SULFATE 1 MG/ML IJ SOLN: 0.2500 mg | Freq: Once | INTRAMUSCULAR | Status: DC

## 2017-03-09 MED ORDER — LIDOCAINE HCL (PF) 1 % IJ SOLN: 30.0000 mL | INTRAMUSCULAR | Status: DC | PRN

## 2017-03-09 MED ORDER — OXYCODONE-ACETAMINOPHEN 5-325 MG PO TABS: 1.0000 | ORAL_TABLET | ORAL | Status: DC | PRN

## 2017-03-09 MED ORDER — ONDANSETRON HCL 4 MG/2ML IJ SOLN: 4.0000 mg | Freq: Four times a day (QID) | INTRAMUSCULAR | Status: DC | PRN

## 2017-03-09 MED ORDER — AMOXICILLIN 500 MG PO CAPS
500.0000 mg | ORAL_CAPSULE | Freq: Three times a day (TID) | ORAL | Status: DC
  Administered 2017-03-11 – 2017-03-13 (×9): 500 mg via ORAL
  Filled 2017-03-09 (×11): qty 1

## 2017-03-09 MED ORDER — LACTATED RINGERS IV SOLN
INTRAVENOUS | Status: DC
  Administered 2017-03-09: 09:00:00 via INTRAVENOUS

## 2017-03-09 MED ORDER — TERBUTALINE SULFATE 1 MG/ML IJ SOLN
INTRAMUSCULAR | Status: AC
  Filled 2017-03-09: qty 1

## 2017-03-09 MED ORDER — AMPICILLIN SODIUM 2 G IJ SOLR
2.0000 g | Freq: Four times a day (QID) | INTRAMUSCULAR | Status: DC
  Administered 2017-03-09 – 2017-03-11 (×7): 2 g via INTRAVENOUS
  Filled 2017-03-09 (×7): qty 2000

## 2017-03-09 MED ORDER — AZITHROMYCIN 250 MG PO TABS
1000.0000 mg | ORAL_TABLET | Freq: Once | ORAL | Status: AC
  Administered 2017-03-09: 1000 mg via ORAL
  Filled 2017-03-09: qty 4

## 2017-03-09 MED ORDER — INDOMETHACIN 25 MG PO CAPS
25.0000 mg | ORAL_CAPSULE | Freq: Four times a day (QID) | ORAL | Status: AC
  Administered 2017-03-09 – 2017-03-11 (×7): 25 mg via ORAL
  Filled 2017-03-09 (×11): qty 1

## 2017-03-09 MED ORDER — OXYTOCIN BOLUS FROM INFUSION: 500.0000 mL | Freq: Once | INTRAVENOUS | Status: DC

## 2017-03-09 MED ORDER — CALCIUM GLUCONATE 10 % IV SOLN
INTRAVENOUS | Status: AC
  Filled 2017-03-09: qty 10

## 2017-03-09 MED ORDER — LACTATED RINGERS IV SOLN: INTRAVENOUS | Status: DC

## 2017-03-09 MED ORDER — LACTATED RINGERS IV SOLN: 500.0000 mL | INTRAVENOUS | Status: DC | PRN

## 2017-03-09 MED ORDER — BETAMETHASONE SOD PHOS & ACET 6 (3-3) MG/ML IJ SUSP
12.0000 mg | INTRAMUSCULAR | Status: AC
  Administered 2017-03-09 – 2017-03-10 (×2): 12 mg via INTRAMUSCULAR
  Filled 2017-03-09: qty 2

## 2017-03-09 MED ORDER — MAGNESIUM SULFATE 50 % IJ SOLN
2.0000 g/h | INTRAVENOUS | Status: DC
  Filled 2017-03-09 (×2): qty 80

## 2017-03-09 MED ORDER — ACETAMINOPHEN 325 MG PO TABS
650.0000 mg | ORAL_TABLET | ORAL | Status: DC | PRN
  Administered 2017-03-11: 650 mg via ORAL
  Filled 2017-03-09: qty 2

## 2017-03-09 NOTE — Progress Notes (Signed)
To bedside r/t vital sign alarm.  Pulse 152 bpm.  In discussion with husband.  States that she has just found out that her husband has fathered another woman's baby.  Encouraged to take some deep breaths and try to remain calm.  Tearful. Husband remains at bedside.  States that she would like her parents to return to the room as they had stepped out for a while to allow her to rest.  Will continue to closely monitor.  Social work consult has been placed since admission this AM.
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
## 2017-03-09 NOTE — OB Triage Note (Signed)
Pt presents to L&D with c/o gush of fluid at 0300, pt went to [facility] and continued to gush fluid. She stated she thought she was peeing on herself. She took a bath but continued to leak fluid and decided to come in. Pt declines contractions or pain, and denies bleeding, reports decreased fetal movement but upon arrival states she felt a movement. EFM applied and explained. Nitrazine positive. M. Lawhorn called, orders received.
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
## 2017-03-09 NOTE — Progress Notes (Signed)
Bedside sono performed as patient unable to get to ultrasound today due to high volume and being on Magnesium Sulfate.   Ultrasound notes SIUP, vertex female, measuring 30.2 weeks, EFW 1457-1459 grams (3.3 - 3.4 lbs), AFI 3.8.  Posterior placenta.     Hildred Laserherry, Tawania Daponte, MD [facility]

## 2017-03-09 NOTE — Progress Notes (Signed)
Tracy AntuRachel M [name] is a 21 y.o. G2P0010 at 2882w2d by LMP admitted for PPROM  Subjective:  Pt is sitting in bed crying. She states that she is stressed out due to newly discovered issues with her and FOB.   Tracy [name] reports leakage of fluid with movement.   Denies difficulty breathing or respiratory distress, chest pain, headache, fever, abdominal pain, contractions, and leg pain or swelling.   Objective:  Temp:  [98.1 F (36.7 C)-98.9 F (37.2 C)] 98.1 F (36.7 C) (06/01 2010) Pulse Rate:  [66-134] 108 (06/01 2049) Resp:  [16-18] 16 (06/01 1609) BP: (107-145)/(44-94) 114/73 (06/01 2049) SpO2:  [98 %-100 %] 100 % (06/01 2100) Weight:  [135 lb (61.2 kg)] 135 lb (61.2 kg) (06/01 0539)  I/O last 3 completed shifts: In: 1691.7 [P.O.:200; I.V.:1441.7; IV Piggyback:50] Out: 3850 [Urine:3850] No intake/output data recorded.   Physical exam:   General: Alert and oriented x 4, no apparent distress  Respiratory: CTAB  Cardiac: RRR  Abomen: soft, gravid, non-tender  FHT:  FHR: 135 bpm, variability: moderate,  accelerations:  Present,  decelerations:  Absent UC:   none SVE:    not indicated at this time  Lower extremities: Reflexes WNL, negative homans sign, no edema or swelling  Labs:  Lab Results  Component Value Date   WBC 9.9 03/09/2017   HGB 11.6 (L) 03/09/2017   HCT 33.2 (L) 03/09/2017   MCV 88.0 03/09/2017   PLT 260 03/09/2017    Assessment:  Tracy AntuRachel M Verastegui is a 21 y.o. G2P0010 at 3282w2d admitted for preterm premature rupture of membranes, Rh positive, rubella non-immune, varicella non-immune, magnesium sulfate for neruoprotection  FHR Category I  Plan:  Encouraged patient to decreases external stressors at this time.  Offered emotional support.  Continue orders as written.  Reassess as needed.  Reviewed red flag symptoms and when to notify staff.  Report given to NNP on call, Neonatologist with round in AM.  Growth/AFI US in AM, see note from Dr Valentino Saxonherry, re  bedside ultrasound.    Gunnar BullaJenkins Michelle Lawhorn, [place]  03/09/2017 1815

## 2017-03-10 ENCOUNTER — Inpatient Hospital Stay: Payer: Medicaid Other

## 2017-03-10 DIAGNOSIS — O42113 Preterm premature rupture of membranes, onset of labor more than 24 hours following rupture, third trimester: Secondary | ICD-10-CM

## 2017-03-10 DIAGNOSIS — O0933 Supervision of pregnancy with insufficient antenatal care, third trimester: Secondary | ICD-10-CM

## 2017-03-10 DIAGNOSIS — O0930 Supervision of pregnancy with insufficient antenatal care, unspecified trimester: Secondary | ICD-10-CM

## 2017-03-10 DIAGNOSIS — R1030 Lower abdominal pain, unspecified: Secondary | ICD-10-CM

## 2017-03-10 DIAGNOSIS — Z3A30 30 weeks gestation of pregnancy: Secondary | ICD-10-CM

## 2017-03-10 DIAGNOSIS — O0932 Supervision of pregnancy with insufficient antenatal care, second trimester: Secondary | ICD-10-CM

## 2017-03-10 LAB — RPR: RPR Ser Ql: NONREACTIVE

## 2017-03-10 LAB — URINE CULTURE: Special Requests: NORMAL

## 2017-03-10 MED ORDER — SODIUM CHLORIDE FLUSH 0.9 % IV SOLN
INTRAVENOUS | Status: AC
  Filled 2017-03-10: qty 10

## 2017-03-10 MED ORDER — PRENATAL MULTIVITAMIN CH
1.0000 | ORAL_TABLET | Freq: Every day | ORAL | Status: DC
  Administered 2017-03-10 – 2017-03-18 (×8): 1 via ORAL
  Filled 2017-03-10 (×15): qty 1

## 2017-03-10 MED ORDER — COMPLETENATE 29-1 MG PO CHEW
1.0000 | CHEWABLE_TABLET | Freq: Every day | ORAL | Status: DC
  Filled 2017-03-10: qty 1

## 2017-03-10 MED ORDER — ZOLPIDEM TARTRATE 5 MG PO TABS
ORAL_TABLET | ORAL | Status: AC
  Filled 2017-03-10: qty 1

## 2017-03-10 MED ORDER — ZOLPIDEM TARTRATE 5 MG PO TABS
5.0000 mg | ORAL_TABLET | Freq: Every evening | ORAL | Status: DC | PRN
  Administered 2017-03-10 – 2017-03-11 (×2): 5 mg via ORAL
  Filled 2017-03-10: qty 1

## 2017-03-10 NOTE — Progress Notes (Signed)
Antenatal Progress Note  Subjective:     Patient ID: Tracy [name] is a 21 y.o.G2P0010 female 3126w3d , Estimated Date of Delivery: 05/16/17 by Patient's last menstrual period was 08/09/2016. consistent with seond trimester sono who was admitted for PPROM on 03/09/2017.  HD# 2.   Subjective:    Patient preparing to take showers. No current complains or questions.   Review of Systems  Denies difficulty breathing or respiratory distress, chest pain, fever, headache, abdominal pain, contractions, vaginal bleeding, leakage of fluid, and leg pain or swelling.   She endorses good fetal movement.    Objective:   BP (!) 108/54 (BP Location: Left Arm)   Pulse 77   Temp 98.1 F (36.7 C) (Oral)   Resp 12   Ht 5\' 1"  (1.549 m)   Wt 135 lb (61.2 kg)   LMP 08/09/2016   SpO2 100%   BMI 25.51 kg/m   General appearance: alert and cooperative  Abdomen: soft, non-tender, gravid  Pelvic: deferred  Extremities: extremities normal, atraumatic, no cyanosis or edema  Labs:   Results for orders placed or performed during the hospital encounter of 03/09/17  Urine culture  Result Value Ref Range   Specimen Description URINE, CLEAN CATCH    Special Requests Normal    Culture MULTIPLE SPECIES PRESENT, SUGGEST RECOLLECTION (A)    Report Status 03/10/2017 FINAL   Culture, beta strep (group b only)  Result Value Ref Range   Specimen Description VAGINA    Special Requests NONE    Culture      CULTURE REINCUBATED FOR BETTER GROWTH Performed at [facility], [address] [place], [place] [address]    Report Status PENDING   Wet prep, genital  Result Value Ref Range   Yeast Wet Prep HPF POC NONE SEEN NONE SEEN   Trich, Wet Prep NONE SEEN NONE SEEN   Clue Cells Wet Prep HPF POC NONE SEEN NONE SEEN   WBC, Wet Prep HPF POC FEW (A) NONE SEEN   Sperm NONE SEEN   Chlamydia/NGC rt PCR ([facility] only)  Result Value Ref Range   Specimen source GC/Chlam URINE, RANDOM    Chlamydia Tr NOT  DETECTED NOT DETECTED   N gonorrhoeae NOT DETECTED NOT DETECTED  CBC  Result Value Ref Range   WBC 9.9 3.6 - 11.0 K/uL   RBC 3.77 (L) 3.80 - 5.20 MIL/uL   Hemoglobin 11.6 (L) 12.0 - 16.0 g/dL   HCT 60.433.2 (L) 54.035.0 - 98.147.0 %   MCV 88.0 80.0 - 100.0 fL   MCH 30.7 26.0 - 34.0 pg   MCHC 34.9 32.0 - 36.0 g/dL   RDW 19.114.3 47.811.5 - 29.514.5 %   Platelets 260 150 - 440 K/uL  RPR  Result Value Ref Range   RPR Ser Ql Non Reactive Non Reactive  Urinalysis, Complete w Microscopic  Result Value Ref Range   Color, Urine YELLOW (A) YELLOW   APPearance HAZY (A) CLEAR   Specific Gravity, Urine 1.010 1.005 - 1.030   pH 7.0 5.0 - 8.0   Glucose, UA NEGATIVE NEGATIVE mg/dL   Hgb urine dipstick SMALL (A) NEGATIVE   Bilirubin Urine NEGATIVE NEGATIVE   Ketones, ur NEGATIVE NEGATIVE mg/dL   Protein, ur NEGATIVE NEGATIVE mg/dL   Nitrite NEGATIVE NEGATIVE   Leukocytes, UA MODERATE (A) NEGATIVE   RBC / HPF 0-5 0 - 5 RBC/hpf   WBC, UA TOO NUMEROUS TO COUNT 0 - 5 WBC/hpf   Bacteria, UA NONE SEEN NONE SEEN  Squamous Epithelial / LPF 0-5 (A) NONE SEEN   Mucous PRESENT   Type and screen [facility]  Result Value Ref Range   ABO/RH(D) O POS    Antibody Screen NEG    Sample Expiration 03/12/2017     Assessment:  21 y.o. female [redacted]w[redacted]d, Estimated Date of Delivery: 05/16/17 with:    Patient Active Problem List   Diagnosis Date Noted  . Limited prenatal care 03/10/2017  . Late prenatal care affecting pregnancy in second trimester 03/10/2017  . Preterm premature rupture of membranes (PPROM) with unknown onset of labor 03/09/2017     Plan:   Continue orders as written.  Reassess as needed.  Reviewed red flag symptoms and when to call.    Gunnar Bulla, CNM 03/10/17 5:43 PM

## 2017-03-10 NOTE — Progress Notes (Signed)
Pt to L&D for NST, no complaints.

## 2017-03-10 NOTE — Consult Note (Signed)
Neonatology Prenatal Consultation: Requested by: Valentino Saxonherry Reason: Pre-term labor 30 weeks.  I spoke with Tracy [name] this AM, diagnosed with PPROM, s/p betamethasone x 2.  Ultrasound shows diminished amniotic fluid and she continues to have leakage of amniotic fluid.  I noted that premature babies born at [redacted] weeks gestation may have respiratory problems that need mechanical ventilation but that this was less likely since she had received betamethasone.  I also mentioned the small risk of intracranial bleeding and the likely duration of hospitalization for a baby at [redacted] weeks gestation.  We discussed the desirability of her pumping breast milk to supply nutrition for the baby and need to pump regularly every 3 hours or so.  We also discussed the need for nasogastric tube feedings as well as IV feedings until milk feedings could be established.  She was told to contact me again if she has additional questions.  Tracy [name] M.D.  15 minutes face-to-face time

## 2017-03-10 NOTE — Progress Notes (Signed)
Antenatal Progress Note  Subjective:     Patient ID: Tracy [name] is a 21 y.o.G2P0010 female 5297w3d , Estimated Date of Delivery: 05/16/17 by Patient's last menstrual period was 08/09/2016. consistent with seond trimester sono who was admitted for PPROM on 03/09/2017.  HD# 2.   Subjective:    Pt sitting up in bed speaking with family members. She has just returned from her growth and AFI ultrasound. Reports baby is "measuring a good size, but fluid level is low".   Reports last leakage of fluid was last night while she was sleeping.   Review of Systems  Denies difficulty breathing or respiratory distress, chest pain, fever, headache, abdominal pain, contractions, vaginal bleeding, and leg pain or swelling.   She endorses good fetal movement.    Objective:   Temp:  [97.3 F (36.3 C)-98.9 F (37.2 C)] 98.1 F (36.7 C) (06/02 0848) Pulse Rate:  [80-134] 102 (06/02 0841) Resp:  [16-18] 18 (06/02 0709) BP: (95-145)/(44-94) 102/59 (06/02 0841) SpO2:  [94 %-100 %] 100 % (06/02 0700)  General appearance: alert and cooperative  Lungs: clear to auscultation bilaterally  Heart: regular rate and rhythm, S1, S2 normal, no murmur, click, rub or gallop  Abdomen: soft, non-tender, gravid  Pelvic: deferred  Extremities: extremities normal, atraumatic, no cyanosis or edema  FHT: baseline 135 bpm, accels present, decels absent.  Variability:moderate  Toco: no contractions  Labs:   Results for orders placed or performed during the hospital encounter of 03/09/17  Wet prep, genital  Result Value Ref Range   Yeast Wet Prep HPF POC NONE SEEN NONE SEEN   Trich, Wet Prep NONE SEEN NONE SEEN   Clue Cells Wet Prep HPF POC NONE SEEN NONE SEEN   WBC, Wet Prep HPF POC FEW (A) NONE SEEN   Sperm NONE SEEN   Chlamydia/NGC rt PCR ([facility] only)  Result Value Ref Range   Specimen source GC/Chlam URINE, RANDOM    Chlamydia Tr NOT DETECTED NOT DETECTED   N gonorrhoeae NOT DETECTED NOT DETECTED   CBC  Result Value Ref Range   WBC 9.9 3.6 - 11.0 K/uL   RBC 3.77 (L) 3.80 - 5.20 MIL/uL   Hemoglobin 11.6 (L) 12.0 - 16.0 g/dL   HCT 16.133.2 (L) 09.635.0 - 04.547.0 %   MCV 88.0 80.0 - 100.0 fL   MCH 30.7 26.0 - 34.0 pg   MCHC 34.9 32.0 - 36.0 g/dL   RDW 40.914.3 81.111.5 - 91.414.5 %   Platelets 260 150 - 440 K/uL  RPR  Result Value Ref Range   RPR Ser Ql Non Reactive Non Reactive  Urinalysis, Complete w Microscopic  Result Value Ref Range   Color, Urine YELLOW (A) YELLOW   APPearance HAZY (A) CLEAR   Specific Gravity, Urine 1.010 1.005 - 1.030   pH 7.0 5.0 - 8.0   Glucose, UA NEGATIVE NEGATIVE mg/dL   Hgb urine dipstick SMALL (A) NEGATIVE   Bilirubin Urine NEGATIVE NEGATIVE   Ketones, ur NEGATIVE NEGATIVE mg/dL   Protein, ur NEGATIVE NEGATIVE mg/dL   Nitrite NEGATIVE NEGATIVE   Leukocytes, UA MODERATE (A) NEGATIVE   RBC / HPF 0-5 0 - 5 RBC/hpf   WBC, UA TOO NUMEROUS TO COUNT 0 - 5 WBC/hpf   Bacteria, UA NONE SEEN NONE SEEN   Squamous Epithelial / LPF 0-5 (A) NONE SEEN   Mucous PRESENT   Type and screen [facility]  Result Value Ref Range   ABO/RH(D) O POS    Antibody  Screen NEG    Sample Expiration 03/12/2017     Assessment:  21 y.o. female [redacted]w[redacted]d, Estimated Date of Delivery: 05/16/17 with:    Patient Active Problem List   Diagnosis Date Noted  . Limited prenatal care 03/10/2017  . Late prenatal care affecting pregnancy in second trimester 03/10/2017  . Preterm premature rupture of membranes (PPROM) with unknown onset of labor 03/09/2017     Plan:   Discontinue IVF and magnesium sulfate.  May have regular diet. Resume prenatal vitamin.  Reviewed red flag symptoms and when to notify staff.  Neonatology consult this morning.  Continue orders as written. Reassess as needed.    Vanessa [place], [place] 03/10/17 [phone]

## 2017-03-10 NOTE — Progress Notes (Signed)
Pt being taken down to ultrasound accompanied by RN.

## 2017-03-10 NOTE — Progress Notes (Addendum)
Pt transferred to floor from observation. She is in NAD, and denies pain. VSS: BP 109/65 (BP Location: Left Arm)   Pulse 87   Temp 98 F (36.7 C) (Oral)   Resp 16   Ht 5\' 1"  (1.549 m)   Wt 135 lb (61.2 kg)   LMP 08/09/2016   SpO2 98%   BMI 25.51 kg/m . Pt currently resting in bed with family at bedside. Pt educated to call staff is she experiences vaginal leakage, vaginal bleeding, abdominal pain, abdominal cramping, and/or contractions. Pt verbalized understanding.

## 2017-03-11 DIAGNOSIS — O42113 Preterm premature rupture of membranes, onset of labor more than 24 hours following rupture, third trimester: Secondary | ICD-10-CM

## 2017-03-11 DIAGNOSIS — R1030 Lower abdominal pain, unspecified: Secondary | ICD-10-CM

## 2017-03-11 DIAGNOSIS — Z3A30 30 weeks gestation of pregnancy: Secondary | ICD-10-CM

## 2017-03-11 LAB — CULTURE, BETA STREP (GROUP B ONLY)

## 2017-03-11 NOTE — Progress Notes (Signed)
Pt. Sitting up in chair. Alert and oriented,smiling. Color good, skin w&d. Denies contractions. States small amt. Of amniotic Fluid intermitt. Pad assessed on Bathroom and scant amt. Of clear fluid noted on pad as well as scant amt. Of thin, white mucous. FHR is strong at 154 and visible Fetal movement. Pt. States Fetus has been moving, " A lot". Pt. Instructed to call RN for any contractions or Vag. Bleeding or any change in vaginal discharge or decrease in Fetal movement. Pt. V/O. Pt. Is going to the B/P for scheduled NST.

## 2017-03-11 NOTE — Progress Notes (Signed)
Patient back to room on mother baby unit. No complaints at this time.

## 2017-03-11 NOTE — Progress Notes (Signed)
Antenatal Progress Note  Subjective:     Patient ID: Lenell AntuRachel M Baillie is a 21 y.o.G2P0010 female 6544w4d , Estimated Date of Delivery: 05/16/17 by Patient's last menstrual period was 08/09/2016. consistent with seond trimester sono who was admitted for PPROM on 03/09/2017.  HD# 3.   Subjective:    Pt resting quietly in bed, speaking with significant other. She has questions regarding birthing options, induction of labor, and pain management techniques.   Review of Systems  Denies difficulty breathing or respiratory distress, chest pain, fever, headache, abdominal pain or tenderness, contractions, vaginal bleeding, leakage of fluid, and leg pain or swelling.   She endorses good fetal movement.    Objective:   BP (!) 96/49 (BP Location: Right Arm) Comment: nurse Stanton KidneyKiara Thomas notified  Pulse 73   Temp 97.4 F (36.3 C) (Oral)   Resp 18   Ht 5\' 1"  (1.549 m)   Wt 135 lb (61.2 kg)   LMP 08/09/2016   SpO2 100%   BMI 25.51 kg/m   General appearance: alert and cooperative  Abdomen: soft, non-tender, gravid  Pelvic: deferred  Extremities: extremities normal, atraumatic, no cyanosis or edema  Labs:   Results for orders placed or performed during the hospital encounter of 03/09/17  Urine culture  Result Value Ref Range   Specimen Description URINE, CLEAN CATCH    Special Requests Normal    Culture MULTIPLE SPECIES PRESENT, SUGGEST RECOLLECTION (A)    Report Status 03/10/2017 FINAL   Culture, beta strep (group b only)  Result Value Ref Range   Specimen Description VAGINA    Special Requests NONE    Culture      NO GROUP B STREP (S.AGALACTIAE) ISOLATED Performed at [facility], [address] [place], [place] [address]    Report Status 03/11/2017 FINAL   Wet prep, genital  Result Value Ref Range   Yeast Wet Prep HPF POC NONE SEEN NONE SEEN   Trich, Wet Prep NONE SEEN NONE SEEN   Clue Cells Wet Prep HPF POC NONE SEEN NONE SEEN   WBC, Wet Prep HPF POC FEW (A) NONE  SEEN   Sperm NONE SEEN   Chlamydia/NGC rt PCR ([facility] only)  Result Value Ref Range   Specimen source GC/Chlam URINE, RANDOM    Chlamydia Tr NOT DETECTED NOT DETECTED   N gonorrhoeae NOT DETECTED NOT DETECTED  CBC  Result Value Ref Range   WBC 9.9 3.6 - 11.0 K/uL   RBC 3.77 (L) 3.80 - 5.20 MIL/uL   Hemoglobin 11.6 (L) 12.0 - 16.0 g/dL   HCT 57.833.2 (L) 46.935.0 - 62.947.0 %   MCV 88.0 80.0 - 100.0 fL   MCH 30.7 26.0 - 34.0 pg   MCHC 34.9 32.0 - 36.0 g/dL   RDW 52.814.3 41.311.5 - 24.414.5 %   Platelets 260 150 - 440 K/uL  RPR  Result Value Ref Range   RPR Ser Ql Non Reactive Non Reactive  Urinalysis, Complete w Microscopic  Result Value Ref Range   Color, Urine YELLOW (A) YELLOW   APPearance HAZY (A) CLEAR   Specific Gravity, Urine 1.010 1.005 - 1.030   pH 7.0 5.0 - 8.0   Glucose, UA NEGATIVE NEGATIVE mg/dL   Hgb urine dipstick SMALL (A) NEGATIVE   Bilirubin Urine NEGATIVE NEGATIVE   Ketones, ur NEGATIVE NEGATIVE mg/dL   Protein, ur NEGATIVE NEGATIVE mg/dL   Nitrite NEGATIVE NEGATIVE   Leukocytes, UA MODERATE (A) NEGATIVE   RBC / HPF 0-5 0 - 5 RBC/hpf  WBC, UA TOO NUMEROUS TO COUNT 0 - 5 WBC/hpf   Bacteria, UA NONE SEEN NONE SEEN   Squamous Epithelial / LPF 0-5 (A) NONE SEEN   Mucous PRESENT   Type and screen [facility]  Result Value Ref Range   ABO/RH(D) O POS    Antibody Screen NEG    Sample Expiration 03/12/2017     Assessment:  21 y.o. female [redacted]w[redacted]d, Estimated Date of Delivery: 05/16/17 with:    Patient Active Problem List   Diagnosis Date Noted  . Limited prenatal care 03/10/2017  . Late prenatal care affecting pregnancy in second trimester 03/10/2017  . Preterm premature rupture of membranes (PPROM) with unknown onset of labor 03/09/2017     Plan:   Discussed birthing options: c-section vs vaginal birth including rationale, risks and benefits as well as pain management techniques including nitrous oxide, IV medications, and epidural anesthesia.    Encouraged activities to prevent restlessness including playing cards, board games, and coloring books.   Continue orders as written.   Reassess as needed.   Reviewed red flag symptoms and when to call.    Gunnar Bulla, CNM 03/11/17 10:31 AM

## 2017-03-11 NOTE — Progress Notes (Signed)
Pt. Is back for NST. B. Thea SilversmithSpielman RN stated the NST was reactive and Pt. Was not having any contractions. She also stated that she notified Emerson MonteMichele Lawhorn CNM of the NST results and that no new orders were given.

## 2017-03-11 NOTE — Progress Notes (Signed)
Patient to the birthplace for daily NST. Patient currently states no pain, bloody discharge or contractions. Patient anxious about situation but educated on classes available for her and dad. Emotional support also given. Patient verbalizes understanding of teaching.

## 2017-03-12 DIAGNOSIS — Z3A30 30 weeks gestation of pregnancy: Secondary | ICD-10-CM

## 2017-03-12 DIAGNOSIS — R1030 Lower abdominal pain, unspecified: Secondary | ICD-10-CM

## 2017-03-12 DIAGNOSIS — O42113 Preterm premature rupture of membranes, onset of labor more than 24 hours following rupture, third trimester: Secondary | ICD-10-CM

## 2017-03-12 MED ORDER — LACTATED RINGERS IV BOLUS (SEPSIS)
500.0000 mL | Freq: Once | INTRAVENOUS | Status: AC
  Administered 2017-03-12: 500 mL via INTRAVENOUS

## 2017-03-12 MED ORDER — CALCIUM CARBONATE ANTACID 500 MG PO CHEW: 2.0000 | CHEWABLE_TABLET | ORAL | Status: DC | PRN

## 2017-03-12 MED ORDER — SODIUM CHLORIDE FLUSH 0.9 % IV SOLN
INTRAVENOUS | Status: AC
  Filled 2017-03-12: qty 10

## 2017-03-12 MED ORDER — ACETAMINOPHEN 325 MG PO TABS
650.0000 mg | ORAL_TABLET | ORAL | Status: DC | PRN
  Administered 2017-03-12 – 2017-03-16 (×10): 650 mg via ORAL
  Filled 2017-03-12 (×10): qty 2

## 2017-03-12 MED ORDER — DOCUSATE SODIUM 100 MG PO CAPS
100.0000 mg | ORAL_CAPSULE | Freq: Every day | ORAL | Status: DC
  Administered 2017-03-12 – 2017-03-16 (×5): 100 mg via ORAL
  Filled 2017-03-12 (×5): qty 1

## 2017-03-12 MED ORDER — SODIUM CHLORIDE 0.9% FLUSH
3.0000 mL | Freq: Two times a day (BID) | INTRAVENOUS | Status: DC
  Administered 2017-03-12 – 2017-03-15 (×7): 3 mL via INTRAVENOUS

## 2017-03-12 MED ORDER — SODIUM CHLORIDE 0.9% FLUSH
3.0000 mL | INTRAVENOUS | Status: DC | PRN
  Administered 2017-03-13: 3 mL via INTRAVENOUS
  Administered 2017-03-15: 5 mL via INTRAVENOUS
  Administered 2017-03-16: 3 mL via INTRAVENOUS
  Filled 2017-03-12 (×3): qty 3

## 2017-03-12 MED ORDER — SODIUM CHLORIDE 0.9 % IV SOLN: 250.0000 mL | INTRAVENOUS | Status: DC | PRN

## 2017-03-12 NOTE — Progress Notes (Signed)
03/11/2017 2050 Contacted by Kriste BasqueBecky, RN on birth place. Pt to unit for evening NST.   NST reactive with baseline FHR 135-140 bpm, moderate variability, accelerations present, and intermittent non-reccurent variable decelerations.   Pt denies any pain or contractions at this time. Reports occasional, small amount of amniotic fluid leakage.   Pt may return to mother baby unit. Continue orders as written. Reassess as needed.   Gunnar BullaJenkins Michelle Alitza Cowman, CNM

## 2017-03-12 NOTE — Progress Notes (Signed)
LABOR NOTE   Tracy [name] 20 y.o.GP@ at 3534w5d PPROM  SUBJECTIVE:  She presented today from antepartum for contractions every 3-4 minutes.  OBJECTIVE:  BP 116/63 (BP Location: Left Arm)   Pulse 90   Temp 98.1 F (36.7 C) (Oral)   Resp 18   Ht 5\' 1"  (1.549 m)   Wt 135 lb (61.2 kg)   LMP 08/09/2016   SpO2 98%   BMI 25.51 kg/m  No intake/output data recorded.  She has not been examined CERVIX: not evaluated CONTRACTIONS: regular, every 2-4 minutes FHR: Fetal heart tracing reviewed. Baseline: 140 bpm, Variability: Good {> 6 bpm), Accelerations: Reactive and Decelerations: Variable: down to 90 Category II   Analgesia: none  Labs: Lab Results  Component Value Date   WBC 9.9 03/09/2017   HGB 11.6 (L) 03/09/2017   HCT 33.2 (L) 03/09/2017   MCV 88.0 03/09/2017   PLT 260 03/09/2017    ASSESSMENT: 1) Labor curve reviewed.       Progress: Not in labor. and will continue to monitor for labor     Membranes: ruptured      2)  Labor interventions: Encourage PO hydration. Pt unable to drink, 500 ml bolus of IV fluid.   Active Problems:   Preterm premature rupture of membranes (PPROM) with unknown onset of labor   Limited prenatal care   Late prenatal care affecting pregnancy in second trimester   PLAN: Expectant management.   Doreene BurkeAnnie Tavin Vernet, CNM

## 2017-03-12 NOTE — Consult Note (Addendum)
[facility] Consultation   Chief Complaint: Admitted for PPROM.   HPI: Tracy [name] is a 21 y.o. G2P0010 at 5127w5d by LMP and 1327w5d US performed at [facility] who is referred for MFM consultation regarding PPROM.  The patient, now HD # 3, was admitted. She was contracting when she was admitted, but then the contractions stopped until an hour ago, when they starting coming every 3 minutes.  She has increased leaking with every contraction, but no bleeding.  Now the contractions are painful and she finds it hard to concentrate with them.    She is receiving latency antibiotics per protocol. She completed the Zithromax portion (1000 mg) on 03/09/2017.  She is S/P 6 g magnesium for neuroprotection on 03/09/2017 followed by 2 g/hr on 6/2.  She is S/P 2 doses of betamethasone.  Her second dose was 03/10/2017 at 0526.     She received 50 mg indomethacin tocolysis on 6/1 and 25 mg on 6/3.  during steroid administration.     Obstetric History:  Obstetric History   G2   P0   T0   P0   A1   L0    SAB0   TAB1   Ectopic0   Multiple0   Live Births0     # Outcome Date GA Lbr Len/2nd Weight Sex Delivery Anes PTL Lv  2 Current           1 TAB 2016        ND      Gynecologic History:  Benign  Past Medical History: Patient  has a past medical history of Abortion history; Headache; Heart murmur; and Irregular periods.   Past Surgical History: She  has a past surgical history that includes adnoids and Wisdom tooth extraction.   Medications: Amoxicillin 500 mg q 8 hours Colace 100 mg qd Tylenol, Tums, Zofran prn  Allergies: Patient has No Known Allergies.   Social History: Patient  reports that she has quit smoking. She has never used smokeless tobacco. She reports that she does not drink alcohol or use drugs.   Family History: family history includes Hypertension in her maternal grandmother.   Review of Systems A full 12 point review of systems was negative or as noted in the  History of Present Illness.  Physical Exam: BP (!) 100/57 (BP Location: Right Arm)   Pulse 66   Temp 98 F (36.7 C) (Oral)   Resp 18   Ht 5\' 1"  (1.549 m)   Wt 135 lb (61.2 kg)   LMP 08/09/2016   SpO2 98%   BMI 25.51 kg/m   Tmax for the last 24 hours is 98.9 Exam: Neck - no nodes or masses Lungs - clear Heart - RRR Back - no tenderness Abdomen - fundus firm with contraction, otherwise, nontender Pelvic - deferred Extremities - nontender, no edema  US today - cephalic, EFW 1653 g (52nd%tile), AFI = 2.0 cm  NST today - baseline FHR 140.  Accels to 150-160.  Uterine contractions reported at the end of the monitoring period.  One 10 second variable deceleration to 110 bpm.  Impression: reactive NSt.  Asessement: 21 yo gravida 2 para 0010 at 427w5d gestation with PPROM S/P steroids, S/P magnesium for neuroprotection having contractions Recommendations: (These were conveyed to her nurse-midwife Doreene BurkeAnnie Thompson) 1. Transfer to L & D for monitoring while she is contracting 2. Continue latency antibiotics per protocol 3. Now that she is > 24 hours since her last dose of betamethasone, refrain  from  further tocolysis . 4. If contractions cease and she returns to the floor, implement twice daily monitoring per protocol and weekly [place] to verify position and fluid volume.    Total time spent with the patient was 40 minutes with greater than 50% spent in counseling and coordination of care.  We appreciate this interesting consult and will be happy to be involved in the ongoing care of Tracy [name] in anyway her obstetricians desire.  Argentina Ponder, MD [facility]

## 2017-03-12 NOTE — Progress Notes (Signed)
Pt states contractions are about 5 minutes apart and lasting for about a minute. Noted scant bleeding on pad

## 2017-03-12 NOTE — Progress Notes (Signed)
Antenatal Progress Note  Subjective:     Patient ID: Tracy [name] is a 21 y.o.G2P0010 female [redacted]w[redacted]d , Estimated Date of Delivery: 05/16/17 by Patient's last menstrual period was 08/09/2016. consistent with seond trimester sono who was admitted for PPROM on 03/09/2017.  HD# 4.   Subjective:    Pt resting quietly in bed, speaking with significant other.   Reports an occasional, small amount of fluid leakage. None at this time.    Review of Systems  Denies difficulty breathing or respiratory distress, chest pain, fever, headache, abdominal pain or tenderness, contractions, vaginal bleeding, and leg pain or swelling.   She endorses good fetal movement.    Objective:   Temp:  [97.4 F (36.3 C)-99.3 F (37.4 C)] 98.1 F (36.7 C) (06/04 0427) Pulse Rate:  [70-102] 72 (06/04 0427) Resp:  [16-20] 20 (06/04 0427) BP: (96-122)/(46-61) 113/54 (06/04 0427) SpO2:  [97 %-100 %] 97 % (06/04 0427)  General appearance: alert and cooperative  Abdomen: soft, non-tender, gravid  Pelvic: deferred  Extremities: extremities normal, atraumatic, no cyanosis or edema  Labs:   Results for orders placed or performed during the hospital encounter of 03/09/17  Urine culture  Result Value Ref Range   Specimen Description URINE, CLEAN CATCH    Special Requests Normal    Culture MULTIPLE SPECIES PRESENT, SUGGEST RECOLLECTION (A)    Report Status 03/10/2017 FINAL   Culture, beta strep (group b only)  Result Value Ref Range   Specimen Description VAGINA    Special Requests NONE    Culture      NO GROUP B STREP (S.AGALACTIAE) ISOLATED Performed at [facility], [address], [place], [place] [address]    Report Status 03/11/2017 FINAL   Wet prep, genital  Result Value Ref Range   Yeast Wet Prep HPF POC NONE SEEN NONE SEEN   Trich, Wet Prep NONE SEEN NONE SEEN   Clue Cells Wet Prep HPF POC NONE SEEN NONE SEEN   WBC, Wet Prep HPF POC FEW (A) NONE SEEN   Sperm NONE SEEN    Chlamydia/NGC rt PCR ([facility] only)  Result Value Ref Range   Specimen source GC/Chlam URINE, RANDOM    Chlamydia Tr NOT DETECTED NOT DETECTED   N gonorrhoeae NOT DETECTED NOT DETECTED  CBC  Result Value Ref Range   WBC 9.9 3.6 - 11.0 K/uL   RBC 3.77 (L) 3.80 - 5.20 MIL/uL   Hemoglobin 11.6 (L) 12.0 - 16.0 g/dL   HCT 60.4 (L) 54.0 - 98.1 %   MCV 88.0 80.0 - 100.0 fL   MCH 30.7 26.0 - 34.0 pg   MCHC 34.9 32.0 - 36.0 g/dL   RDW 19.1 47.8 - 29.5 %   Platelets 260 150 - 440 K/uL  RPR  Result Value Ref Range   RPR Ser Ql Non Reactive Non Reactive  Urinalysis, Complete w Microscopic  Result Value Ref Range   Color, Urine YELLOW (A) YELLOW   APPearance HAZY (A) CLEAR   Specific Gravity, Urine 1.010 1.005 - 1.030   pH 7.0 5.0 - 8.0   Glucose, UA NEGATIVE NEGATIVE mg/dL   Hgb urine dipstick SMALL (A) NEGATIVE   Bilirubin Urine NEGATIVE NEGATIVE   Ketones, ur NEGATIVE NEGATIVE mg/dL   Protein, ur NEGATIVE NEGATIVE mg/dL   Nitrite NEGATIVE NEGATIVE   Leukocytes, UA MODERATE (A) NEGATIVE   RBC / HPF 0-5 0 - 5 RBC/hpf   WBC, UA TOO NUMEROUS TO COUNT 0 - 5 WBC/hpf  Bacteria, UA NONE SEEN NONE SEEN   Squamous Epithelial / LPF 0-5 (A) NONE SEEN   Mucous PRESENT   Type and screen [facility]  Result Value Ref Range   ABO/RH(D) O POS    Antibody Screen NEG    Sample Expiration 03/12/2017     Assessment:  21 y.o. female 6887w4d, Estimated Date of Delivery: 05/16/17 with:    Patient Active Problem List   Diagnosis Date Noted  . Limited prenatal care 03/10/2017  . Late prenatal care affecting pregnancy in second trimester 03/10/2017  . Preterm premature rupture of membranes (PPROM) with unknown onset of labor 03/09/2017     Plan:   Encouraged activities to prevent restlessness including playing cards, board games, and coloring books. Brought cards, gel pens, and coloring pages for pt today.   MFM consult for plan of care.   Continue orders as written.    Reassess as needed.   Reviewed red flag symptoms and when to call.    Gunnar BullaJenkins Michelle Nihar Klus, CNM 03/12/17 8:17 AM

## 2017-03-12 NOTE — Progress Notes (Signed)
Notified M. Lawhorn CNM that Pt. Has had Interm. Episodes of clear vaginal leakage. M. Lawhorn CNM stated that she has been aware of this. Also, notified CNM that pt. Has had a Temp. Of 99.0  The last two checks. I also informed her that the Urine culture that was sent on June 1 had "Multiple Species Present" and the recommendation was for repeat Testing. M. Lawhorn CNM stated that she was waiting for the Pt.'s Amniotic Fluid to stop leaking before repeating the test. Pt. Is resting with eyes closed. Denied contractions, vaginal bleeding and positive Fetal movement at last assessment. New orders received. Will cont. To monitor closely.

## 2017-03-12 NOTE — Progress Notes (Signed)
Maternal-Fetal medicine notified of consult at this time.     Oswald HillockAbigail Garner, RN

## 2017-03-12 NOTE — Progress Notes (Signed)
Urine sent for Culture as per standing order.

## 2017-03-12 NOTE — Progress Notes (Signed)
Dr. Fayrene FearingJames with maternal-fetal medicine came to evaluate patient. During consult, MD confirmed contractions every 3 minutes apart by palpation. Dr. Fayrene FearingJames notified Doreene BurkeAnnie Thompson and orders were given to move patient to L&D. Moving patient to L&D at this time.   Oswald HillockAbigail Garner, RN

## 2017-03-13 DIAGNOSIS — Z3A30 30 weeks gestation of pregnancy: Secondary | ICD-10-CM

## 2017-03-13 DIAGNOSIS — O42113 Preterm premature rupture of membranes, onset of labor more than 24 hours following rupture, third trimester: Secondary | ICD-10-CM

## 2017-03-13 DIAGNOSIS — R1030 Lower abdominal pain, unspecified: Secondary | ICD-10-CM

## 2017-03-13 LAB — URINE CULTURE
Culture: <10000 — AB
Special Requests: NORMAL

## 2017-03-13 NOTE — Progress Notes (Signed)
LABOR NOTE   Tracy AntuRachel M Mcbee 21 y.o.GP@ at 9319w6d Not in labor. and PRROM  SUBJECTIVE:  She is having occasional contractions but nothing that has progressed in duration and intensity. She took tylenol last night for back pain which helped.  OBJECTIVE:  BP 106/68 (BP Location: Right Arm)   Pulse 67   Temp 97.7 F (36.5 C) (Oral)   Resp 18   Ht 5\' 1"  (1.549 m)   Wt 135 lb (61.2 kg)   LMP 08/09/2016   SpO2 100%   BMI 25.51 kg/m  No intake/output data recorded.  Lungs: Clear bilaterally HR: RRR Bowel sounds: active all 4 quadrants Abdomen , soft, non tender Legs: no swelling, 2+ reflexes bilaterally  CERVIX: not evaluatedCONTRACTIONS:  Analgesia: Occasional tylenol   Labs: Lab Results  Component Value Date   WBC 9.9 03/09/2017   HGB 11.6 (L) 03/09/2017   HCT 33.2 (L) 03/09/2017   MCV 88.0 03/09/2017   PLT 260 03/09/2017    ASSESSMENT: 1) Labor curve reviewed.       Progress: Not in labor.     Membranes: ruptured, PPROM      2)  None  Active Problems:   Preterm premature rupture of membranes (PPROM) with unknown onset of labor   Limited prenatal care   Late prenatal care affecting pregnancy in second trimester   PLAN: expectant management  Doreene BurkeAnnie Heavenleigh Petruzzi, CNM

## 2017-03-13 NOTE — Progress Notes (Signed)
LABOR NOTE   Tracy [name] 20 y.o.GP@ at 7429w6d Not in labor. and PRROM  SUBJECTIVE:  Contractions have slowed down and are no longer painful.  OBJECTIVE:  BP 106/68 (BP Location: Right Arm)   Pulse 67   Temp 97.7 F (36.5 C) (Oral)   Resp 18   Ht 5\' 1"  (1.549 m)   Wt 135 lb (61.2 kg)   LMP 08/09/2016   SpO2 100%   BMI 25.51 kg/m  No intake/output data recorded.   CERVIX: not evaluatedCONTRACTIONS: irregular, every 5-8 minutes FHR: Fetal heart tracing reviewed. Baseline: 140 bpm, Variability: Good {> 6 bpm), Accelerations: Reactive and Decelerations: Variable: occasional Category II appropriate for gestational age and PPROM   Analgesia: Occasional tylenol   Labs: Lab Results  Component Value Date   WBC 9.9 03/09/2017   HGB 11.6 (L) 03/09/2017   HCT 33.2 (L) 03/09/2017   MCV 88.0 03/09/2017   PLT 260 03/09/2017    ASSESSMENT: 1) Labor curve reviewed.       Progress: Not in labor.     Membranes: ruptured      2)  None  Active Problems:   Preterm premature rupture of membranes (PPROM) with unknown onset of labor   Limited prenatal care   Late prenatal care affecting pregnancy in second trimester   PLAN: expectant management  Doreene BurkeAnnie Iyani Dresner, CNM

## 2017-03-14 ENCOUNTER — Inpatient Hospital Stay: Payer: Medicaid Other

## 2017-03-14 DIAGNOSIS — Z3A31 31 weeks gestation of pregnancy: Secondary | ICD-10-CM

## 2017-03-14 DIAGNOSIS — R1030 Lower abdominal pain, unspecified: Secondary | ICD-10-CM

## 2017-03-14 DIAGNOSIS — O42113 Preterm premature rupture of membranes, onset of labor more than 24 hours following rupture, third trimester: Secondary | ICD-10-CM

## 2017-03-14 MED ORDER — AMOXICILLIN 500 MG PO CAPS: 500.0000 mg | ORAL_CAPSULE | Freq: Three times a day (TID) | ORAL | Status: DC

## 2017-03-14 MED ORDER — AMOXICILLIN 500 MG PO CAPS
500.0000 mg | ORAL_CAPSULE | Freq: Three times a day (TID) | ORAL | Status: AC
  Administered 2017-03-14 – 2017-03-15 (×5): 500 mg via ORAL
  Filled 2017-03-14 (×7): qty 1

## 2017-03-14 NOTE — Progress Notes (Signed)
Tracy [name] [id number] y.o.GP@ at 595w0d Not in labor. and PRROM  SUBJECTIVE: Contractions have decreased in frequency to approximately 8-10 min. and are less internee.  OBJECTIVE:  BP 106/68 (BP Location: Right Arm)  Pulse 67  Temp 97.7 F (36.5 C) (Oral)  Resp 18  Ht 5\' 1"  (1.549 [name])  Wt 135 lb (61.2 kg)  LMP 08/09/2016  SpO2 100%  BMI 25.51 kg/[name]  No intake/output data recorded.   CERVIX: not evaluated  Analgesia:Occasional tylenol   Labs: Recent Labs       Lab Results  Component Value Date   WBC 9.9 03/09/2017   HGB 11.6 (L) 03/09/2017   HCT 33.2 (L) 03/09/2017   MCV 88.0 03/09/2017   PLT 260 03/09/2017      ASSESSMENT: 1)Progress: Not in labor.  NST: Reactive  Membranes: ruptured  2) None  Active Problems: Preterm premature rupture of membranes (PPROM) with unknown onset of labor Limited prenatal care Late prenatal care affecting pregnancy in second trimester   PLAN: expectant management  Doreene BurkeAnnie Nkosi Cortright, CNM

## 2017-03-14 NOTE — Progress Notes (Signed)
Lenell AntuRachel M Bradeen 21 y.o.GP@ at 3647w6d Not in labor. and PRROM  SUBJECTIVE: is having regular contraction 2-3 min for the past 2 hrs. That are 4/10 on pain scale. OBJECTIVE:  BP 106/68 (BP Location: Right Arm)   Pulse 67   Temp 97.7 F (36.5 C) (Oral)   Resp 18   Ht 5\' 1"  (1.549 m)   Wt 135 lb (61.2 kg)   LMP 08/09/2016   SpO2 100%   BMI 25.51 kg/m  No intake/output data recorded. Lungs: clear bilaterally HR: RRR Abdomen: contraction palpated mild to moderate, pt breathing with contractions. Soft and non tender between contractions. Legs: no swelling   CERVIX: not evaluated  Analgesia: Occasional tylenol   Labs: Recent Labs       Lab Results  Component Value Date   WBC 9.9 03/09/2017   HGB 11.6 (L) 03/09/2017   HCT 33.2 (L) 03/09/2017   MCV 88.0 03/09/2017   PLT 260 03/09/2017      ASSESSMENT: 1)Progress: Not in labor.  NST: Reactive      Membranes: ruptured      2)  None  Active Problems:   Preterm premature rupture of membranes (PPROM) with unknown onset of labor   Limited prenatal care   Late prenatal care affecting pregnancy in second trimester   PLAN: expectant management, tylenol for pain, PO hydrate. If continued contraction will send to L&D for monitoring.   Doreene BurkeAnnie Shatara Stanek, CNM

## 2017-03-15 DIAGNOSIS — R1030 Lower abdominal pain, unspecified: Secondary | ICD-10-CM

## 2017-03-15 DIAGNOSIS — Z3A31 31 weeks gestation of pregnancy: Secondary | ICD-10-CM

## 2017-03-15 DIAGNOSIS — O42113 Preterm premature rupture of membranes, onset of labor more than 24 hours following rupture, third trimester: Secondary | ICD-10-CM

## 2017-03-15 LAB — CBC WITH DIFFERENTIAL/PLATELET
Basophils Absolute: 0 10*3/uL (ref 0–0.1)
Basophils Relative: 0 %
Eosinophils Absolute: 0 10*3/uL (ref 0–0.7)
Eosinophils Relative: 1 %
HCT: 31.1 % — ABNORMAL LOW (ref 35.0–47.0)
Hemoglobin: 10.6 g/dL — ABNORMAL LOW (ref 12.0–16.0)
Lymphocytes Relative: 21 %
Lymphs Abs: 2.3 10*3/uL (ref 1.0–3.6)
MCH: 29.7 pg (ref 26.0–34.0)
MCHC: 34.1 g/dL (ref 32.0–36.0)
MCV: 87 fL (ref 80.0–100.0)
Monocytes Absolute: 0.7 10*3/uL (ref 0.2–0.9)
Monocytes Relative: 7 %
Neutro Abs: 7.8 10*3/uL — ABNORMAL HIGH (ref 1.4–6.5)
Neutrophils Relative %: 71 %
Platelets: 286 10*3/uL (ref 150–440)
RBC: 3.58 MIL/uL — ABNORMAL LOW (ref 3.80–5.20)
RDW: 14.1 % (ref 11.5–14.5)
WBC: 10.9 10*3/uL (ref 3.6–11.0)

## 2017-03-15 MED ORDER — FERROUS SULFATE 325 (65 FE) MG PO TABS
325.0000 mg | ORAL_TABLET | Freq: Two times a day (BID) | ORAL | Status: DC
  Administered 2017-03-15 – 2017-03-18 (×6): 325 mg via ORAL
  Filled 2017-03-15 (×7): qty 1

## 2017-03-15 MED ORDER — AMOXICILLIN 500 MG PO CAPS
500.0000 mg | ORAL_CAPSULE | Freq: Three times a day (TID) | ORAL | Status: DC
  Administered 2017-03-15 – 2017-03-16 (×2): 500 mg via ORAL
  Filled 2017-03-15 (×5): qty 1

## 2017-03-15 NOTE — Clinical Social Work Note (Signed)
CSW attempted to visit with patient however, patient was being evaluated for pain. CSW will attempt to return. York SpanielMonica Joelynn Dust MSW,LCSW [phone]

## 2017-03-15 NOTE — Progress Notes (Signed)
NST performed today was reviewed and was found to be reactive. Baseline 140 with Moderate variability; No decels noted.  Continue current plan of care.  Doreene BurkeAnnie Chancey Ringel, CNM

## 2017-03-15 NOTE — Progress Notes (Signed)
Pt over to L&D for NST. Pt reports +FM, no contractions.

## 2017-03-15 NOTE — Progress Notes (Addendum)
Tonny BranchRachel M [id number] y.o.GP@ at 852w1d Not in labor. and PRROM.    SUBJECTIVE: Contractions have decreased in frequency to approximately 8-10 min. and are less internee.  OBJECTIVE:  BP 106/68 (BP Location: Right Arm)  Pulse 67  Temp 97.7 F (36.5 C) (Oral)  Resp 18  Ht 5\' 1"  (1.549 m)  Wt 135 lb (61.2 kg)  LMP 08/09/2016  SpO2 100%  BMI 25.51 kg/m  No intake/output data recorded.  Lungs: clear HR: RRR Abdomen, soft, nontender Legs: no swelling CERVIX: not evaluated  Analgesia:Occasional tylenol   Labs: Recent Labs       Lab Results  Component Value Date   WBC 10.9 03/09/2017   HGB 10.6 03/09/2017   HCT 31.1 03/09/2017   MCV 87.0 03/09/2017   PLT 286 03/09/2017     Ultrasound 6/5/18_  AFI:  3.7 cm (<2.5 %ile), Cephalic  ASSESSMENT: 1)Progress: Not in labor.  NST: Reactive  Membranes: ruptured  2) None  Active Problems: Preterm premature rupture of membranes (PPROM) with unknown onset of labor Limited prenatal care Late prenatal care affecting pregnancy in second trimester   PLAN: expectant management  Doreene BurkeAnnie Keshawn Fiorito, CNM

## 2017-03-16 DIAGNOSIS — Z3A31 31 weeks gestation of pregnancy: Secondary | ICD-10-CM

## 2017-03-16 DIAGNOSIS — O42113 Preterm premature rupture of membranes, onset of labor more than 24 hours following rupture, third trimester: Secondary | ICD-10-CM

## 2017-03-16 LAB — TYPE AND SCREEN
ABO/RH(D): O POS
Antibody Screen: NEGATIVE

## 2017-03-16 LAB — CBC
HCT: 31.7 % — ABNORMAL LOW (ref 35.0–47.0)
Hemoglobin: 10.9 g/dL — ABNORMAL LOW (ref 12.0–16.0)
MCH: 29.7 pg (ref 26.0–34.0)
MCHC: 34.3 g/dL (ref 32.0–36.0)
MCV: 86.6 fL (ref 80.0–100.0)
Platelets: 308 10*3/uL (ref 150–440)
RBC: 3.66 MIL/uL — ABNORMAL LOW (ref 3.80–5.20)
RDW: 14 % (ref 11.5–14.5)
WBC: 12.7 10*3/uL — ABNORMAL HIGH (ref 3.6–11.0)

## 2017-03-16 MED ORDER — OXYCODONE-ACETAMINOPHEN 5-325 MG PO TABS
1.0000 | ORAL_TABLET | ORAL | Status: DC | PRN
  Administered 2017-03-17 (×2): 1 via ORAL
  Filled 2017-03-16 (×2): qty 1

## 2017-03-16 MED ORDER — SODIUM CHLORIDE FLUSH 0.9 % IV SOLN
INTRAVENOUS | Status: AC
  Filled 2017-03-16: qty 10

## 2017-03-16 MED ORDER — TETANUS-DIPHTH-ACELL PERTUSSIS 5-2.5-18.5 LF-MCG/0.5 IM SUSP: 0.5000 mL | Freq: Once | INTRAMUSCULAR | Status: DC

## 2017-03-16 MED ORDER — ACETAMINOPHEN 325 MG PO TABS: 650.0000 mg | ORAL_TABLET | ORAL | Status: DC | PRN

## 2017-03-16 MED ORDER — OXYTOCIN 40 UNITS IN LACTATED RINGERS INFUSION - SIMPLE MED
INTRAVENOUS | Status: AC
  Filled 2017-03-16: qty 1000

## 2017-03-16 MED ORDER — OXYTOCIN 40 UNITS IN LACTATED RINGERS INFUSION - SIMPLE MED: 2.5000 [IU]/h | INTRAVENOUS | Status: DC

## 2017-03-16 MED ORDER — OXYCODONE HCL 5 MG PO TABS
ORAL_TABLET | ORAL | Status: AC
  Administered 2017-03-16: 10 mg
  Filled 2017-03-16: qty 2

## 2017-03-16 MED ORDER — LIDOCAINE HCL (PF) 1 % IJ SOLN: 30.0000 mL | INTRAMUSCULAR | Status: DC | PRN

## 2017-03-16 MED ORDER — BUTORPHANOL TARTRATE 2 MG/ML IJ SOLN: 1.0000 mg | INTRAMUSCULAR | Status: DC | PRN

## 2017-03-16 MED ORDER — BUTORPHANOL TARTRATE 2 MG/ML IJ SOLN
1.0000 mg | INTRAMUSCULAR | Status: AC | PRN
  Administered 2017-03-16 (×2): 1 mg via INTRAVENOUS
  Filled 2017-03-16 (×2): qty 2

## 2017-03-16 MED ORDER — DOCUSATE SODIUM 100 MG PO CAPS
100.0000 mg | ORAL_CAPSULE | Freq: Two times a day (BID) | ORAL | Status: DC
  Administered 2017-03-17 – 2017-03-18 (×3): 100 mg via ORAL
  Filled 2017-03-16 (×3): qty 1

## 2017-03-16 MED ORDER — BENZOCAINE-MENTHOL 20-0.5 % EX AERO
INHALATION_SPRAY | CUTANEOUS | Status: AC
  Administered 2017-03-16: 1 via TOPICAL
  Filled 2017-03-16: qty 56

## 2017-03-16 MED ORDER — LACTATED RINGERS IV SOLN: 500.0000 mL | INTRAVENOUS | Status: DC | PRN

## 2017-03-16 MED ORDER — WITCH HAZEL-GLYCERIN EX PADS: 1.0000 "application " | MEDICATED_PAD | CUTANEOUS | Status: DC | PRN

## 2017-03-16 MED ORDER — BENZOCAINE-MENTHOL 20-0.5 % EX AERO
1.0000 "application " | INHALATION_SPRAY | CUTANEOUS | Status: DC | PRN
  Administered 2017-03-16: 1 via TOPICAL

## 2017-03-16 MED ORDER — OXYTOCIN 10 UNIT/ML IJ SOLN: 10.0000 [IU] | Freq: Once | INTRAMUSCULAR | Status: DC

## 2017-03-16 MED ORDER — LIDOCAINE HCL (PF) 1 % IJ SOLN
INTRAMUSCULAR | Status: AC
  Filled 2017-03-16: qty 30

## 2017-03-16 MED ORDER — SIMETHICONE 80 MG PO CHEW: 80.0000 mg | CHEWABLE_TABLET | ORAL | Status: DC | PRN

## 2017-03-16 MED ORDER — DIPHENHYDRAMINE HCL 25 MG PO CAPS: 25.0000 mg | ORAL_CAPSULE | Freq: Four times a day (QID) | ORAL | Status: DC | PRN

## 2017-03-16 MED ORDER — OXYTOCIN 10 UNIT/ML IJ SOLN
INTRAMUSCULAR | Status: AC
  Filled 2017-03-16: qty 2

## 2017-03-16 MED ORDER — DIBUCAINE 1 % RE OINT: 1.0000 "application " | TOPICAL_OINTMENT | RECTAL | Status: DC | PRN

## 2017-03-16 MED ORDER — ONDANSETRON HCL 4 MG/2ML IJ SOLN: 4.0000 mg | INTRAMUSCULAR | Status: DC | PRN

## 2017-03-16 MED ORDER — MISOPROSTOL 200 MCG PO TABS
ORAL_TABLET | ORAL | Status: AC
  Filled 2017-03-16: qty 4

## 2017-03-16 MED ORDER — IBUPROFEN 600 MG PO TABS
ORAL_TABLET | ORAL | Status: AC
  Administered 2017-03-16: 600 mg via ORAL
  Filled 2017-03-16: qty 1

## 2017-03-16 MED ORDER — COCONUT OIL OIL
1.0000 "application " | TOPICAL_OIL | Status: DC | PRN
  Administered 2017-03-17: 1 via TOPICAL
  Filled 2017-03-16: qty 120

## 2017-03-16 MED ORDER — SOD CITRATE-CITRIC ACID 500-334 MG/5ML PO SOLN: 30.0000 mL | ORAL | Status: DC | PRN

## 2017-03-16 MED ORDER — OXYCODONE-ACETAMINOPHEN 5-325 MG PO TABS: 2.0000 | ORAL_TABLET | ORAL | Status: DC | PRN

## 2017-03-16 MED ORDER — ONDANSETRON HCL 4 MG PO TABS: 4.0000 mg | ORAL_TABLET | ORAL | Status: DC | PRN

## 2017-03-16 MED ORDER — IBUPROFEN 600 MG PO TABS
600.0000 mg | ORAL_TABLET | Freq: Four times a day (QID) | ORAL | Status: DC
  Administered 2017-03-16 – 2017-03-17 (×2): 600 mg via ORAL
  Filled 2017-03-16: qty 1

## 2017-03-16 MED ORDER — MEASLES, MUMPS & RUBELLA VAC ~~LOC~~ INJ
0.5000 mL | INJECTION | Freq: Once | SUBCUTANEOUS | Status: DC
  Filled 2017-03-16: qty 0.5

## 2017-03-16 MED ORDER — AMMONIA AROMATIC IN INHA
RESPIRATORY_TRACT | Status: AC
  Filled 2017-03-16: qty 10

## 2017-03-16 MED ORDER — LACTATED RINGERS IV SOLN
INTRAVENOUS | Status: DC
  Administered 2017-03-16 (×2): via INTRAVENOUS

## 2017-03-16 MED ORDER — METHYLERGONOVINE MALEATE 0.2 MG/ML IJ SOLN: 0.2000 mg | INTRAMUSCULAR | Status: DC | PRN

## 2017-03-16 MED ORDER — METHYLERGONOVINE MALEATE 0.2 MG PO TABS: 0.2000 mg | ORAL_TABLET | ORAL | Status: DC | PRN

## 2017-03-16 NOTE — Progress Notes (Signed)
IV site edematous and sore while RN attempting to flush; IV flushed wonderful at 2125 during assessment; RN removed this iv site from left hand; RN discussed placing a new IV; pt immediately said, "no, I don't want one, this has been in my hand for 8 days now, it hurts, I don't want it at all"; RN reviewed that the pt does have rights; RN also reviewed with pt "if your status changes, for example, you have vaginal bleeding, your leaking (from ruptured membranes) increases, you start having contractions or you don't feel the baby move, we will need to place another IV AND when you are in active labor you will have to have an IV"; pt said "ok, that's fine, I just don't want another iv tonight"

## 2017-03-16 NOTE — Progress Notes (Addendum)
LABOR NOTE   Tracy AntuRachel M [name] 20 y.o.G2P0@ at 8347w2d PPROM  SUBJECTIVE:  Vaginal bleeding with contractions that are painful  OBJECTIVE:  BP 119/74 (BP Location: Left Arm)   Pulse 65   Temp 98.2 F (36.8 C) (Oral)   Resp 18   Ht 5\' 1"  (1.549 m)   Wt 135 lb (61.2 kg)   LMP 08/09/2016   SpO2 100%   BMI 25.51 kg/m  No intake/output data recorded.  She has shown cervical change. CERVIX: 6cm:100%: -2: mid position: soft SVE:   Dilation: 6 Effacement (%): 100 Station: -2 Exam by:: Janee Mornhompson, CNM CONTRACTIONS: irregular, every 4-5 minutes FHR: Fetal heart tracing reviewed. Baseline: 145 bpm, Variability: Good {> 6 bpm), Accelerations: Reactive and Decelerations: Absent Category I   Analgesia: IV pain meds and pt requesting epidural.   Labs: Lab Results  Component Value Date   WBC 10.9 03/15/2017   HGB 10.6 (L) 03/15/2017   HCT 31.1 (L) 03/15/2017   MCV 87.0 03/15/2017   PLT 286 03/15/2017    ASSESSMENT: 1) Labor curve reviewed.       Progress: Active phase labor.     Membranes: ruptured, clear fluid, PPROM 03/09/17      2)  Nursery notified of labor   Active Problems:   Preterm premature rupture of membranes (PPROM) with unknown onset of labor   Limited prenatal care   Late prenatal care affecting pregnancy in second trimester   PLAN: continue present management  Doreene Burkennie Glenmore Karl, CNM 03/16/2017 4:29 PM

## 2017-03-16 NOTE — Progress Notes (Signed)
Tracy [name] 21 y.o.GP@ at 5257w2d  PPROM on 03/09/17 @ 0300, clear fluid  SUBJECTIVE:  She is complaining of contractions every 2-3 minutes that are painful since around 0930  OBJECTIVE:  BP 120/63 (BP Location: Right Arm)   Pulse 70   Temp 98.2 F (36.8 C) (Oral)   Resp (!) 22   Ht 5\' 1"  (1.549 m)   Wt 135 lb (61.2 kg)   LMP 08/09/2016   SpO2 100%   BMI 25.51 kg/m  No intake/output data recorded.  CERVIX: not evaluated   CONTRACTIONS: regular, every 4-245minutes FHR: Fetal heart tracing reviewed. Baseline: 140 bpm, Variability: Good {> 6 bpm), Accelerations: Reactive and Decelerations: Absent Category I Analgesia: Labor support with  tylenol prn for pain and had 2 doses of stadol through the night.   Labs: Recent Labs       Lab Results  Component Value Date   WBC 10.9 03/15/2017   HGB 10.6 (L) 03/15/2017   HCT 31.1 (L) 03/15/2017   MCV 87.0 03/15/2017   PLT 286 03/15/2017      ASSESSMENT: 1)   Progress: Not in labor.     Membranes: ruptured       Active Problems:   Preterm premature rupture of membranes (PPROM) with unknown onset of labor   Limited prenatal care   Late prenatal care affecting pregnancy in second trimester   PLAN: expectant management, IV fluid bolus , tylenol . Latency antibiotics completed. Discontinued order.   [name], CNM

## 2017-03-16 NOTE — OB Triage Note (Signed)
Patient came in from mother baby at [id number] c/o contractions rating pain 8 out of 10 at lower abdomen. Patient  placed patient on fetal monitors. Reports positive fetal movement. Denies vaginal bleeding.

## 2017-03-16 NOTE — OB Triage Note (Signed)
Patient came in for evening NST. Patient currently on monitors and assessing.

## 2017-03-16 NOTE — Progress Notes (Signed)
LABOR NOTE   Tracy AntuRachel M Dufresne 20 y.o.GP@ at 492w2d  PPROM on 03/09/17 @ 0300, clear fluid  SUBJECTIVE:  She is complaining of contractions every 2-3 minutes that are painful since around 0230. OBJECTIVE:  BP 120/63 (BP Location: Right Arm)   Pulse 70   Temp 98.2 F (36.8 C) (Oral)   Resp (!) 22   Ht 5\' 1"  (1.549 m)   Wt 135 lb (61.2 kg)   LMP 08/09/2016   SpO2 100%   BMI 25.51 kg/m  No intake/output data recorded.  CERVIX: not evaluated   CONTRACTIONS: regular, every 3-424minutes FHR: Fetal heart tracing reviewed. Baseline: 150 bpm, Variability: Good {> 6 bpm), Accelerations: Reactive and Decelerations: Absent Category I Analgesia: Labor support without medications and tylenol prn for pain  Labs: Lab Results  Component Value Date   WBC 10.9 03/15/2017   HGB 10.6 (L) 03/15/2017   HCT 31.1 (L) 03/15/2017   MCV 87.0 03/15/2017   PLT 286 03/15/2017    ASSESSMENT: 1)   Progress: Not in labor.     Membranes: ruptured       Active Problems:   Preterm premature rupture of membranes (PPROM) with unknown onset of labor   Limited prenatal care   Late prenatal care affecting pregnancy in second trimester   PLAN: expectant management, IV fluid bolus and IV pain medication if needed  Doreene Burkennie Chinenye Katzenberger, CNM 03/16/2017 4:00 AM

## 2017-03-16 NOTE — Clinical Social Work Note (Signed)
CSW met with patient and father of baby this afternoon. Patient asked appropriate questions. CSW provided support and encouragement surrounding her prolonged hospitalization. Patient and father of baby report that things are doing good between the two of them at this time. Shela Leff MSW,LCSW [phone]

## 2017-03-17 LAB — CBC
HCT: 30.3 % — ABNORMAL LOW (ref 35.0–47.0)
Hemoglobin: 10.3 g/dL — ABNORMAL LOW (ref 12.0–16.0)
MCH: 29.4 pg (ref 26.0–34.0)
MCHC: 33.9 g/dL (ref 32.0–36.0)
MCV: 86.7 fL (ref 80.0–100.0)
Platelets: 298 10*3/uL (ref 150–440)
RBC: 3.5 MIL/uL — ABNORMAL LOW (ref 3.80–5.20)
RDW: 14.4 % (ref 11.5–14.5)
WBC: 16.3 10*3/uL — ABNORMAL HIGH (ref 3.6–11.0)

## 2017-03-17 MED ORDER — IBUPROFEN 600 MG PO TABS
600.0000 mg | ORAL_TABLET | Freq: Four times a day (QID) | ORAL | Status: DC
  Administered 2017-03-17 – 2017-03-18 (×7): 600 mg via ORAL
  Filled 2017-03-17 (×7): qty 1

## 2017-03-17 NOTE — Clinical Social Work Note (Addendum)
CSW received consult that patient has a child in [place]N. CSW will assess when able.  CSW saw via chart review that the family has already had contact from CSW department. CSW is following for any weekend needs.  Tracy [name], MSW, Theresia MajorsLCSWA [phone]

## 2017-03-17 NOTE — Progress Notes (Signed)
Progress Note - Vaginal Delivery  Tracy [name] is a 21 y.o. [phone] now PP day 0 s/p Vaginal, Spontaneous Delivery .   Subjective:  The patient reports no complaints, up ad lib, voiding and tolerating PO  Objective:  Vital signs in last 24 hours: Temp:  [97.4 F (36.3 C)-98.2 F (36.8 C)] 97.8 F (36.6 C) (06/09 0746) Pulse Rate:  [59-109] 59 (06/09 0746) Resp:  [16-20] 18 (06/09 0746) BP: (96-132)/(46-81) 112/65 (06/09 0746) SpO2:  [99 %-100 %] 100 % (06/09 0421)  Physical Exam:  General: alert, cooperative, appears stated age and no distress  Lungs clear bilaterally Heart: RRR Bowel sounds present Lochia: appropriate, small Uterine Fundus: firm@ U-2 DVT Evaluation: No evidence of DVT seen on physical exam. No cords or calf tenderness.    Data Review  Recent Labs  03/16/17 1701 03/17/17 0336  HGB 10.9* 10.3*  HCT 31.7* 30.3*    Assessment/Plan: Active Problems:   Preterm premature rupture of membranes (PPROM) with unknown onset of labor   Limited prenatal care   Late prenatal care affecting pregnancy in second trimester   Plan for discharge tomorrow  -- Continue routine PP care.     Tracy [name], CNM 03/17/2017 10:25 AM

## 2017-03-18 MED ORDER — NORETHINDRONE 0.35 MG PO TABS: 1.0000 | ORAL_TABLET | Freq: Every day | ORAL | 11 refills | Status: DC

## 2017-03-18 MED ORDER — IBUPROFEN 600 MG PO TABS: 600.0000 mg | ORAL_TABLET | Freq: Four times a day (QID) | ORAL | 0 refills | Status: DC

## 2017-03-18 NOTE — Final Progress Note (Signed)
Discharge Day SOAP Note:  Progress Note - Vaginal Delivery  Tracy [name] is a 21 y.o. [phone] now PP day 2 s/p Vaginal, Spontaneous Delivery . Delivery was complicated by preterm  Subjective  The patient has the following complaints: has no unusual complaints  Pain is controlled with current medications.   Patient is urinating without difficulty.  She is ambulating well.    Objective  Vital signs: BP 101/78 (BP Location: Right Arm)   Pulse 77   Temp 97.9 F (36.6 C) (Oral)   Resp 18   Ht 5\' 1"  (1.549 m)   Wt 135 lb (61.2 kg)   LMP 08/09/2016   SpO2 100%   Breastfeeding? Unknown   BMI 25.51 kg/m   Physical Exam: Gen: NAD Fundus Fundal Tone: Firm  Lochia Amount: Small  Perineum Appearance: Intact                Data Review Labs: CBC Latest Ref Rng & Units 03/17/2017 03/16/2017 03/15/2017  WBC 3.6 - 11.0 K/uL 16.3(H) 12.7(H) 10.9  Hemoglobin 12.0 - 16.0 g/dL 10.3(L) 10.9(L) 10.6(L)  Hematocrit 35.0 - 47.0 % 30.3(L) 31.7(L) 31.1(L)  Platelets 150 - 440 K/uL 298 308 286   O POS  Assessment/Plan  Active Problems:   Preterm premature rupture of membranes (PPROM) with unknown onset of labor   Limited prenatal care   Late prenatal care affecting pregnancy in second trimester    Plan for discharge today.   Discharge Instructions: Per After Visit Summary. Activity: Advance as tolerated. Pelvic rest for 6 weeks.  Also refer to After Visit Summary Diet: Regular Medications: Allergies as of 03/18/2017   No Known Allergies        Medication List    STOP taking these medications   metoCLOPramide 10 MG tablet Commonly known as:  REGLAN     TAKE these medications   ibuprofen 600 MG tablet Commonly known as:  ADVIL,MOTRIN Take 1 tablet (600 mg total) by mouth every 6 (six) hours.   norethindrone 0.35 MG tablet Commonly known as:  MICRONOR,CAMILA,ERRIN Take 1 tablet (0.35 mg total) by mouth daily.   PRENATAL 1+1 PO Take 1 tablet by  mouth daily.      Outpatient follow up:     Follow-up Information    Doreene Burkehompson, Nazaria Riesen, CNM. Schedule an appointment as soon as possible for a visit in 6 day(s).   Specialties:  Certified Nurse Midwife, Radiology Why:  please call [facility] and make your own 6 week follow up appointment Contact information: [address] [place] [address] [phone]          Postpartum contraception: oral progesterone-only contraceptive. Order placed to pharmacy on file. Reviewed to start no sooner than 4 wks. Discussed use.   Discharged Condition: good  Discharged to: home  Newborn Data: Disposition:NICU  Apgars: APGAR (1 MIN): 8   APGAR (5 MINS): 8   APGAR (10 MINS):    Baby Feeding: Breast and pumping breast milk    Doreene BurkeAnnie Manases [place]ch[place], CNM 03/18/2017 1:29 PM

## 2017-03-18 NOTE — Discharge Summary (Signed)
Discharge Summary  Date of Admission: 03/09/2017  Date of Discharge: 03/18/17  Admitting Diagnosis: Premature rupture of membrane at 30 w2d  Secondary Diagnosis: none  Mode of Delivery: normal spontaneous vaginal delivery      Discharge Diagnosis: No other diagnosis   Intrapartum Procedures: none   Post partum procedures: none  Complications: preterm delivery @ 31wks 2 days                      Discharge Day SOAP Note:  Progress Note - Vaginal Delivery  Tracy [name] is a 21 y.o. [phone] now PP day 2 s/p Vaginal, Spontaneous Delivery . Delivery was complicated by preterm  Subjective  The patient has the following complaints: has no unusual complaints  Pain is controlled with current medications.   Patient is urinating without difficulty.  She is ambulating well.    Objective  Vital signs: BP 101/78 (BP Location: Right Arm)   Pulse 77   Temp 97.9 F (36.6 C) (Oral)   Resp 18   Ht 5\' 1"  (1.549 m)   Wt 135 lb (61.2 kg)   LMP 08/09/2016   SpO2 100%   Breastfeeding? Unknown   BMI 25.51 kg/m   Physical Exam: Gen: NAD Fundus Fundal Tone: Firm  Lochia Amount: Small  Perineum Appearance: Intact     Data Review Labs: CBC Latest Ref Rng & Units 03/17/2017 03/16/2017 03/15/2017  WBC 3.6 - 11.0 K/uL 16.3(H) 12.7(H) 10.9  Hemoglobin 12.0 - 16.0 g/dL 10.3(L) 10.9(L) 10.6(L)  Hematocrit 35.0 - 47.0 % 30.3(L) 31.7(L) 31.1(L)  Platelets 150 - 440 K/uL 298 308 286   O POS  Assessment/Plan  Active Problems:   Preterm premature rupture of membranes (PPROM) with unknown onset of labor   Limited prenatal care   Late prenatal care affecting pregnancy in second trimester    Plan for discharge today.   Discharge Instructions: Per After Visit Summary. Activity: Advance as tolerated. Pelvic rest for 6 weeks.  Also refer to After Visit Summary Diet: Regular Medications: Allergies as of 03/18/2017   No Known Allergies     Medication List     STOP taking these medications   metoCLOPramide 10 MG tablet Commonly known as:  REGLAN     TAKE these medications   ibuprofen 600 MG tablet Commonly known as:  ADVIL,MOTRIN Take 1 tablet (600 mg total) by mouth every 6 (six) hours.   norethindrone 0.35 MG tablet Commonly known as:  MICRONOR,CAMILA,ERRIN Take 1 tablet (0.35 mg total) by mouth daily.   PRENATAL 1+1 PO Take 1 tablet by mouth daily.      Outpatient follow up:  Follow-up Information    Doreene Burkehompson, Taos Tapp, CNM. Schedule an appointment as soon as possible for a visit in 6 day(s).   Specialties:  Certified Nurse Midwife, Radiology Why:  please call [facility] and make your own 6 week follow up appointment Contact information: [address] [place] [address] [phone]          Postpartum contraception: oral progesterone-only contraceptive. Order placed to pharmacy on file. Reviewed to start no sooner than 4 wks. Discussed use.   Discharged Condition: good  Discharged to: home  Newborn Data: Disposition:NICU  Apgars: APGAR (1 MIN): 8   APGAR (5 MINS): 8   APGAR (10 MINS):    Baby  Feeding: Breast and pumping breast milk    Doreene Burke, CNM 03/18/2017 1:29 PM

## 2017-03-18 NOTE — Progress Notes (Signed)
Discharge instructions given. Patient verbalizes understanding of teaching.  

## 2017-03-18 NOTE — Discharge Instructions (Signed)
Vaginal Delivery, Care After °Refer to this sheet in the next few weeks. These instructions provide you with information about caring for yourself after vaginal delivery. Your health care provider may also give you more specific instructions. Your treatment has been planned according to current medical practices, but problems sometimes occur. Call your health care provider if you have any problems or questions. °What can I expect after the procedure? °After vaginal delivery, it is common to have: °· Some bleeding from your vagina. °· Soreness in your abdomen, your vagina, and the area of skin between your vaginal opening and your anus (perineum). °· Pelvic cramps. °· Fatigue. ° °Follow these instructions at home: °Medicines °· Take over-the-counter and prescription medicines only as told by your health care provider. °· If you were prescribed an antibiotic medicine, take it as told by your health care provider. Do not stop taking the antibiotic until it is finished. °Driving ° °· Do not drive or operate heavy machinery while taking prescription pain medicine. °· Do not drive for 24 hours if you received a sedative. °Lifestyle °· Do not drink alcohol. This is especially important if you are breastfeeding or taking medicine to relieve pain. °· Do not use tobacco products, including cigarettes, chewing tobacco, or e-cigarettes. If you need help quitting, ask your health care provider. °Eating and drinking °· Drink at least 8 eight-ounce glasses of water every day unless you are told not to by your health care provider. If you choose to breastfeed your baby, you may need to drink more water than this. °· Eat high-fiber foods every day. These foods may help prevent or relieve constipation. High-fiber foods include: °? Whole grain cereals and breads. °? Brown rice. °? Beans. °? Fresh fruits and vegetables. °Activity °· Return to your normal activities as told by your health care provider. Ask your health care provider  what activities are safe for you. °· Rest as much as possible. Try to rest or take a nap when your baby is sleeping. °· Do not lift anything that is heavier than your baby or 10 lb (4.5 kg) until your health care provider says that it is safe. °· Talk with your health care provider about when you can engage in sexual activity. This may depend on your: °? Risk of infection. °? Rate of healing. °? Comfort and desire to engage in sexual activity. °Vaginal Care °· If you have an episiotomy or a vaginal tear, check the area every day for signs of infection. Check for: °? More redness, swelling, or pain. °? More fluid or blood. °? Warmth. °? Pus or a bad smell. °· Do not use tampons or douches until your health care provider says this is safe. °· Watch for any blood clots that may pass from your vagina. These may look like clumps of dark red, brown, or black discharge. °General instructions °· Keep your perineum clean and dry as told by your health care provider. °· Wear loose, comfortable clothing. °· Wipe from front to back when you use the toilet. °· Ask your health care provider if you can shower or take a bath. If you had an episiotomy or a perineal tear during labor and delivery, your health care provider may tell you not to take baths for a certain length of time. °· Wear a bra that supports your breasts and fits you well. °· If possible, have someone help you with household activities and help care for your baby for at least a few days after   you leave the hospital.  Keep all follow-up visits for you and your baby as told by your health care provider. This is important. Contact a health care provider if:  You have: ? Vaginal discharge that has a bad smell. ? Difficulty urinating. ? Pain when urinating. ? A sudden increase or decrease in the frequency of your bowel movements. ? More redness, swelling, or pain around your episiotomy or vaginal tear. ? More fluid or blood coming from your episiotomy or  vaginal tear. ? Pus or a bad smell coming from your episiotomy or vaginal tear. ? A fever. ? A rash. ? Little or no interest in activities you used to enjoy. ? Questions about caring for yourself or your baby.  Your episiotomy or vaginal tear feels warm to the touch.  Your episiotomy or vaginal tear is separating or does not appear to be healing.  Your breasts are painful, hard, or turn red.  You feel unusually sad or worried.  You feel nauseous or you vomit.  You pass large blood clots from your vagina. If you pass a blood clot from your vagina, save it to show to your health care provider. Do not flush blood clots down the toilet without having your health care provider look at them.  You urinate more than usual.  You are dizzy or light-headed.  You have not breastfed at all and you have not had a menstrual period for 12 weeks after delivery.  You have stopped breastfeeding and you have not had a menstrual period for 12 weeks after you stopped breastfeeding. Get help right away if:  You have: ? Pain that does not go away or does not get better with medicine. ? Chest pain. ? Difficulty breathing. ? Blurred vision or spots in your vision. ? Thoughts about hurting yourself or your baby.  You develop pain in your abdomen or in one of your legs.  You develop a severe headache.  You faint.  You bleed from your vagina so much that you fill two sanitary pads in one hour. This information is not intended to replace advice given to you by your health care provider. Make sure you discuss any questions you have with your health care provider. Document Released: 09/22/2000 Document Revised: 03/08/2016 Document Reviewed: 10/10/2015 Elsevier Interactive Patient Education  2017 [employer] Inc[employer]    Breast Pumping Tips If you are breastfeeding, there may be times when you cannot feed your baby directly. Returning to work or going on a trip are examples. Pumping allows you to store  breast milk and feed it to your baby later. You may not get much milk when you first start to pump. Your breasts should start to make more after a few days. If you pump at the times you usually feed your baby, you may be able to keep making enough milk to feed your baby without also using formula. The more often you pump, the more milk your body will make. When should I pump?  You can start to pump soon after you have your baby. Ask your doctor what is right for you and your baby.  If you are going back to work, start pumping a few weeks before. This gives you time to learn how to pump and to store a supply of milk.  When you are with your baby, feed your baby when he or she is hungry. Pump after each feeding.  When you are away from your baby for many hours, pump for about 15  minutes every 2-3 hours. Pump both breasts at the same time if you can.  If your baby has a formula feeding, make sure to pump close to the same time.  If you drink any alcohol, wait 2 hours before pumping. How do I get ready to pump? Your let-down reflex is your body's natural reaction that makes your breast milk flow. It is easier to make your breast milk flow when you are relaxed. Try these things to help you relax:  Smell one of your baby's blankets or an item of clothing.  Look at a picture or video of your baby.  Sit in a quiet, private space.  Massage the breast you plan to pump.  Place soothing warmth on the breast.  Play relaxing music.  What are some breast pumping tips?  Wash your hands before you pump. You do not need to wash your nipples or breasts.  There are three ways to pump. You can: ? Use your hand to massage and squeeze your breast. ? Use a handheld manual pump. ? Use an electric pump.  Make sure the suction cup on the breast pump is the right size. Place the suction cup directly over the nipple. It can be painful or hurt your nipple if it is the wrong size or placed wrong.  Put a  small amount of purified or modified lanolin on your nipple and areola if you are sore.  If you are using an electric pump, change the speed and suction power to be more comfortable.  You may need a different type of pump if pumping hurts or you do not get a lot of milk. Your doctor can help you pick what type of pump to use.  Keep a full water bottle near you always. Drinking lots of fluid helps you make more milk.  You can store your milk to use later. Pumped breast milk can be stored in a sealable, sterile container or plastic bag. Always put the date you pumped it on the container. ? Milk can stay out at room temperature for up to 8 hours. ? You can store your milk in the refrigerator for up to 8 days. ? You can store your milk in the freezer for 3 months. Thaw frozen milk using warm water. Do not put it in the microwave.  Do not smoke. Ask your doctor for help. When should I call my doctor?  You have a hard time pumping.  You are worried you do not make enough milk.  You have nipple pain, soreness, or redness.  You want to take birth control pills. This information is not intended to replace advice given to you by your health care provider. Make sure you discuss any questions you have with your health care provider. Document Released: 03/13/2008 Document Revised: 03/02/2016 Document Reviewed: 07/18/2013 Elsevier Interactive Patient Education  2017 [employer]

## 2017-03-19 NOTE — Progress Notes (Signed)
pt discharged home.  Discharge instructions, prescriptions and follow up appointment given to and reviewed with pt.  Pt verbalized understanding, all questions answered.  Escorted by RN.

## 2017-03-21 ENCOUNTER — Encounter: Payer: Medicaid Other | Admitting: Certified Nurse Midwife

## 2017-03-30 ENCOUNTER — Ambulatory Visit: Payer: Self-pay

## 2017-03-30 NOTE — Lactation Note (Signed)
This note was copied from a baby's chart. Lactation Consultation Note  Patient Name: Girl Fransico MichaelRachel Cleckley UYQIH'KToday's Date: 03/30/2017     Maternal Data    Feeding Feeding Type: Breast Milk  LATCH Score/Interventions                      Lactation Tools Discussed/Used Tools: Pump;1F feeding tube / Syringe   Consult Status   Mom did first lick and learn session to see if this will assist with her milk supply. LC has spoken to mom about pumping q2-3hrs or emptying breasts 8xs in a 24hr period. Mom was able to get more milk after lick and learn.    Burnadette PeterJaniya M Lenette Rau 03/30/2017, 5:07 PM

## 2017-04-04 ENCOUNTER — Encounter: Payer: Medicaid Other | Admitting: Obstetrics and Gynecology

## 2018-02-25 IMAGING — US US OB COMP +14 WK
1 series · 13 of 28 positions shown · non-contrast
Comparison: none

CLINICAL DATA: Premature ruptured membranes.

EXAM:
OBSTETRICAL ULTRASOUND >14 WKS

[Series 1: us ob comp +14 wk · 0.25mm/px · 13 of 81 slices shown]
[im 3/81]
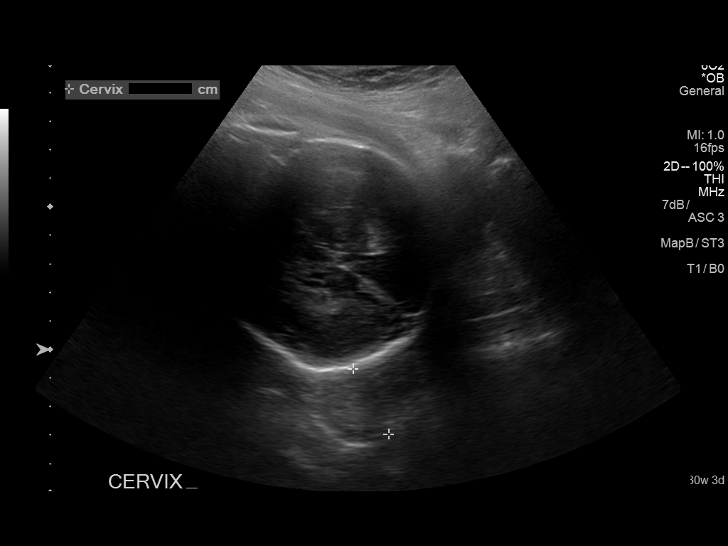
[im 9/81]
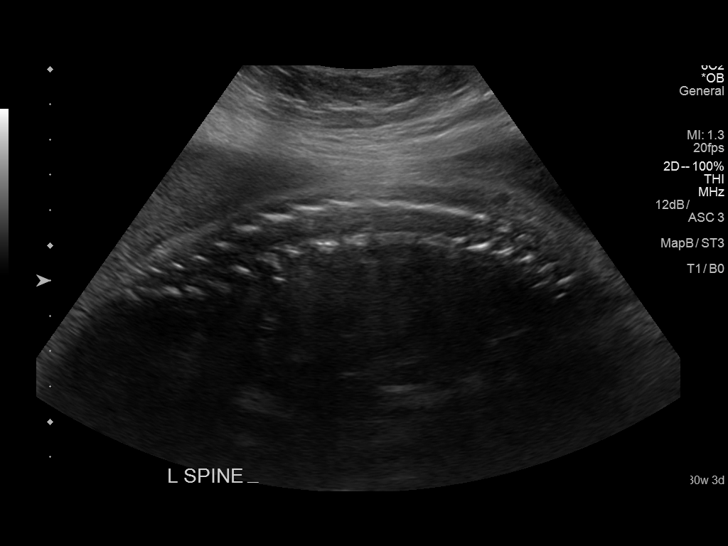
[im 15/81]
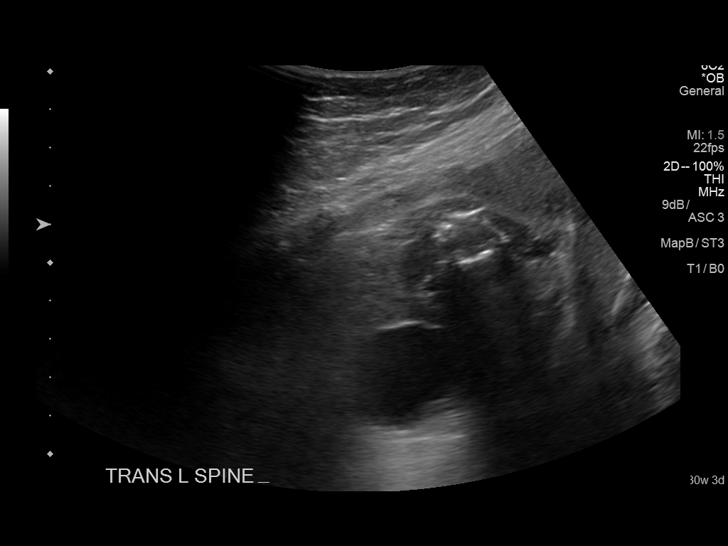
[im 21/81]
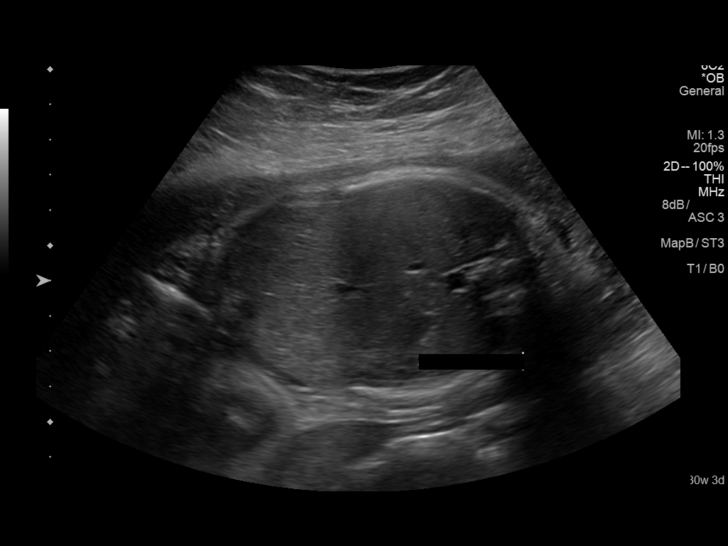
[im 27/81]
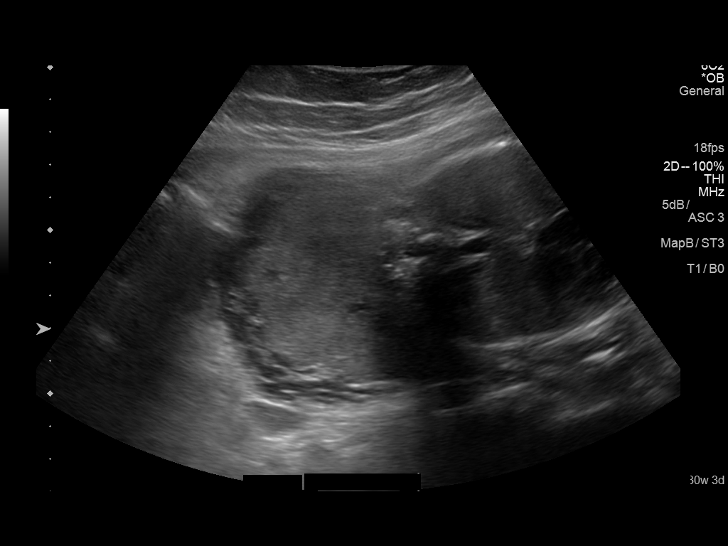
[im 33/81]
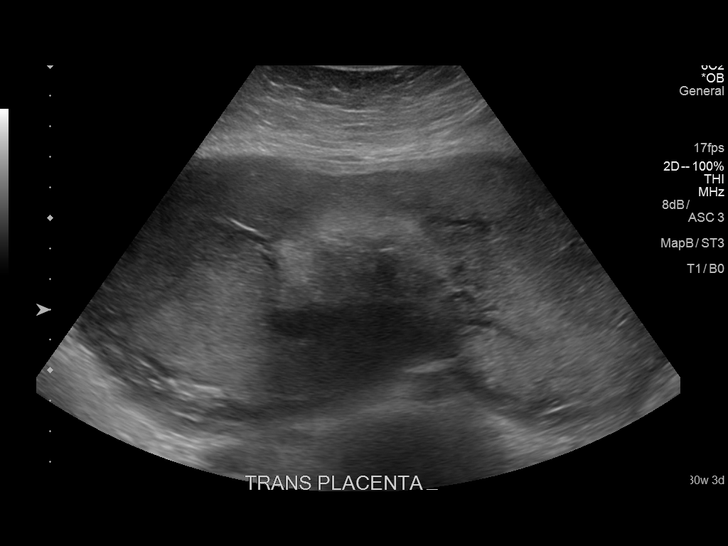
[im 42/81]
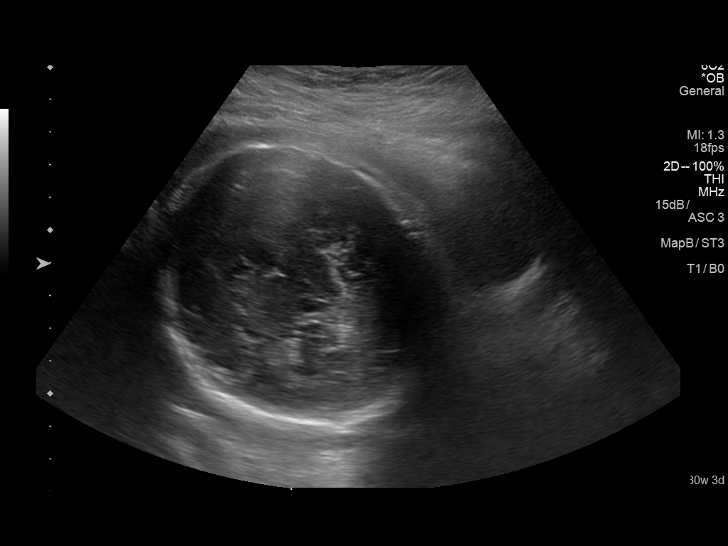
[im 48/81]
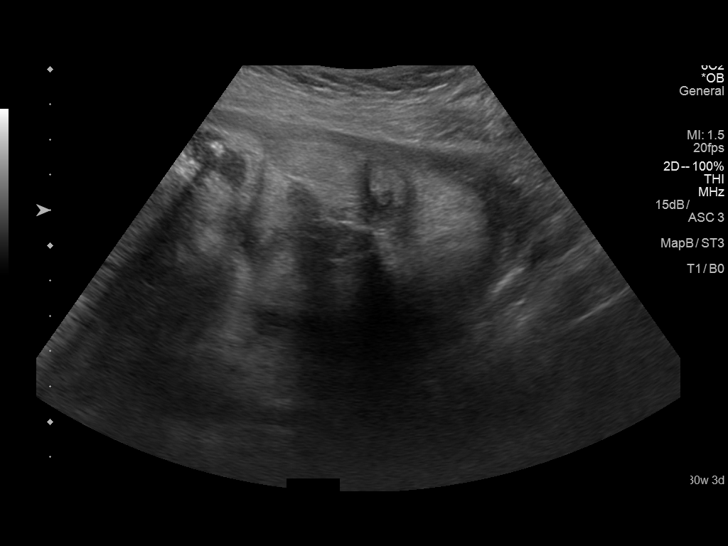
[im 54/81]
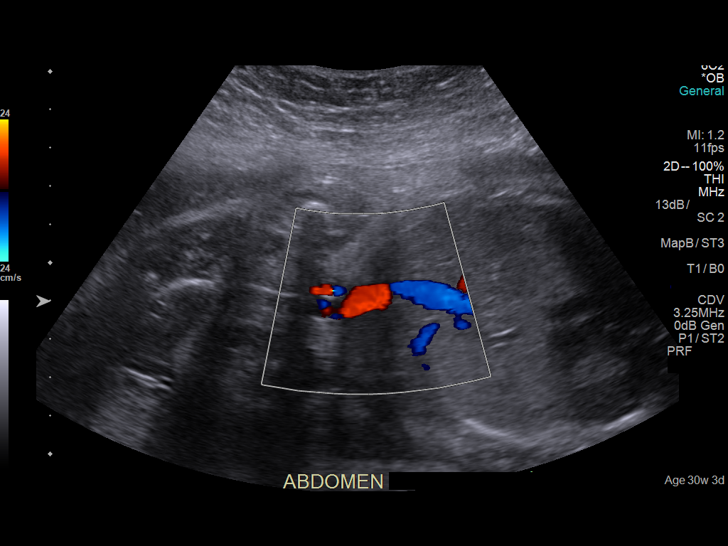
[im 60/81]
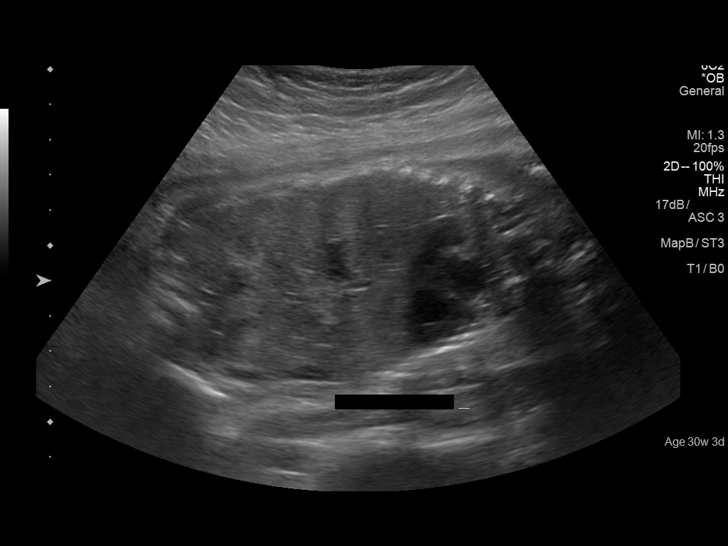
[im 66/81]
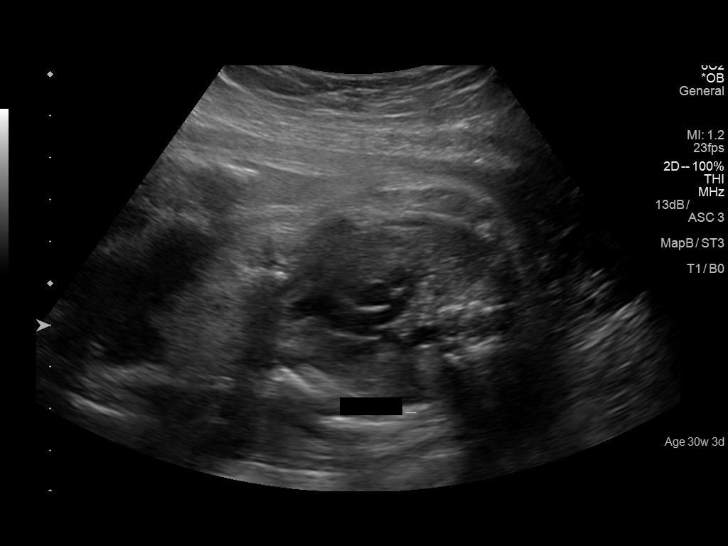
[im 72/81]
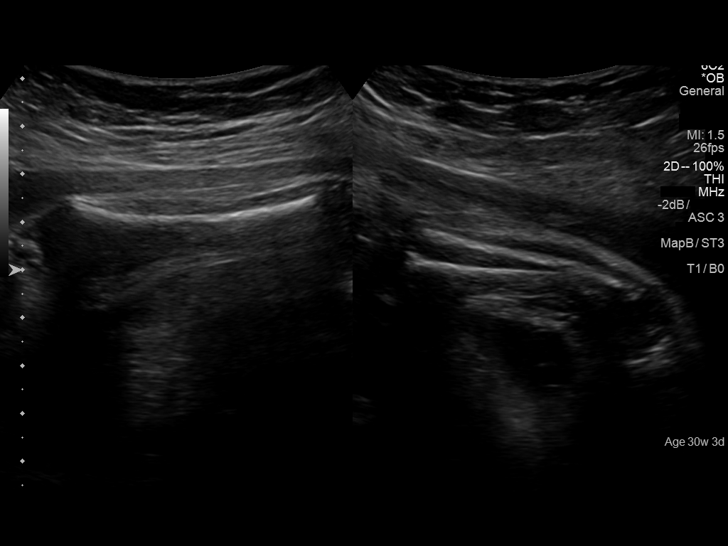
[im 78/81]
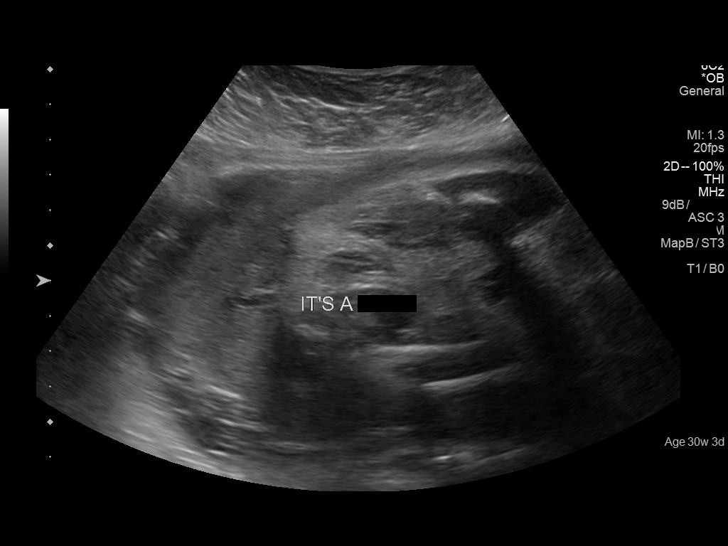

[13 of 28 positions shown; findings below may reference images not displayed]

FINDINGS: Exam made available for my evaluation on today's date. Preliminary
report previously given by the ultrasound technologist and
radiologist on call.

Number of Fetuses: 1

Heart Rate:  141 bpm

Movement: No

Presentation: Decreased

Previa: No

Placental Location: Fundal

Amniotic Fluid (Subjective): Decrease

Amniotic Fluid (Objective):

AFI 2.0 cm (5%ile= 9.0 cm, 95%= 23.4 cm for 30 wks)

FETAL BIOMETRY

BPD:  7.9cm 31w 4d

HC:    27.7cm  30w   2d

AC:   26.7cm  30w   6d

FL:   5.9cm  30w   6d

Current Mean GA: 30w 5d              US EDC: 05/14/2017

Estimated Fetal Weight:  1,653g    52%ile

FETAL ANATOMY

Lateral Ventricles: Visualized

Thalami/CSP: Visualized

Posterior Fossa:  Suboptimal visualization

Nuchal Region: Visualized

Upper Lip: Visualized

Spine: Visualized

4 Chamber Heart on Left: Visualized

LVOT: Visualized

RVOT: Visualized

Stomach on Left: Visualized

3 Vessel Cord: Visualized

Cord Insertion site: Suboptimal visualization

Kidneys: Visualized

Bladder: Visualized

Extremities: Visualized

Technically difficult due to: Low amniotic fluid volume.

Maternal Findings:

Cervix:  2.7 cm and closed
IMPRESSION: Single viable intrauterine pregnancy. No fetal movement noted.
Extremely low amniotic fluid volume consistent with a history of
premature rupture of membranes.

## 2018-03-01 IMAGING — US US OB LIMITED
1 series · 14 of 17 positions shown · non-contrast
Comparison: 03/10/2017

CLINICAL DATA: Premature rupture of membranes. Decreased amniotic
fluid index. Assigned gestational age of 31 weeks 2 days.

EXAM:
LIMITED OBSTETRIC ULTRASOUND

[Series 1: us ob limited · 0.23mm/px · 14 of 17 slices shown]
[im 1/17]
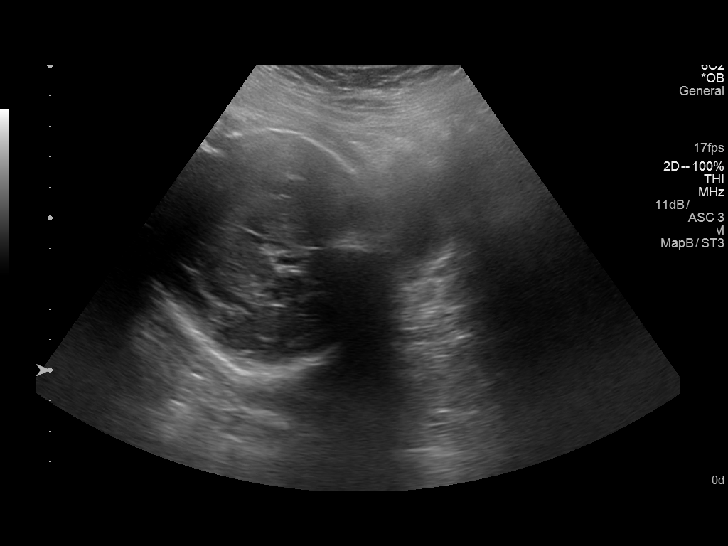
[im 2/17]
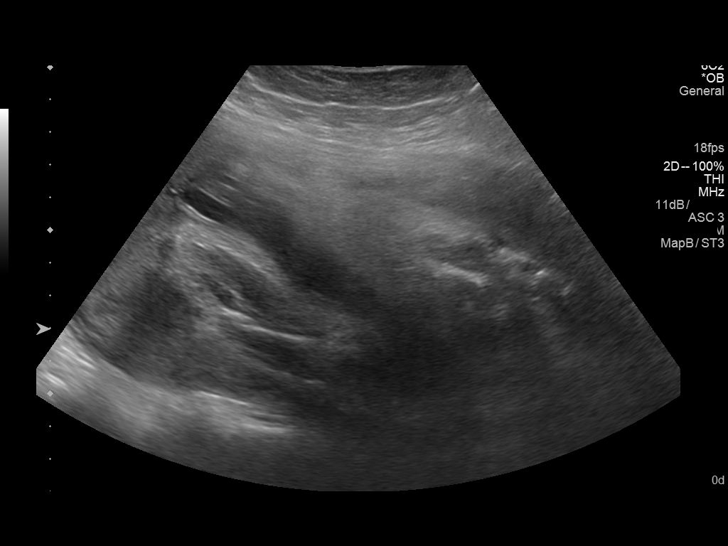
[im 4/17]
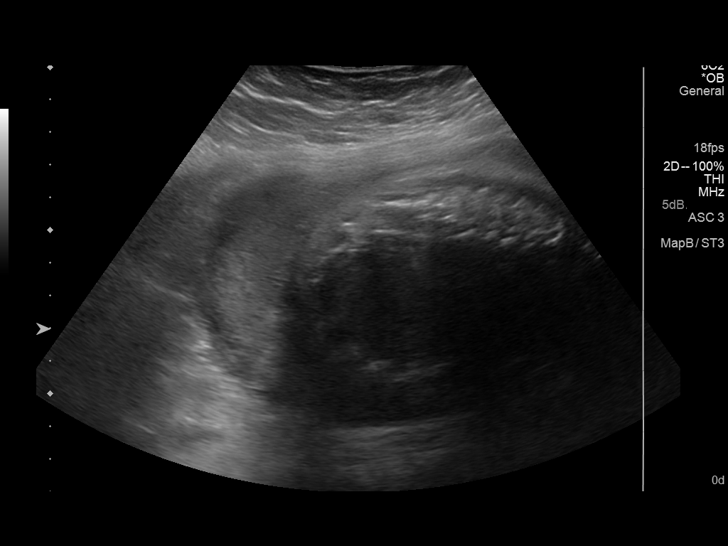
[im 5/17]
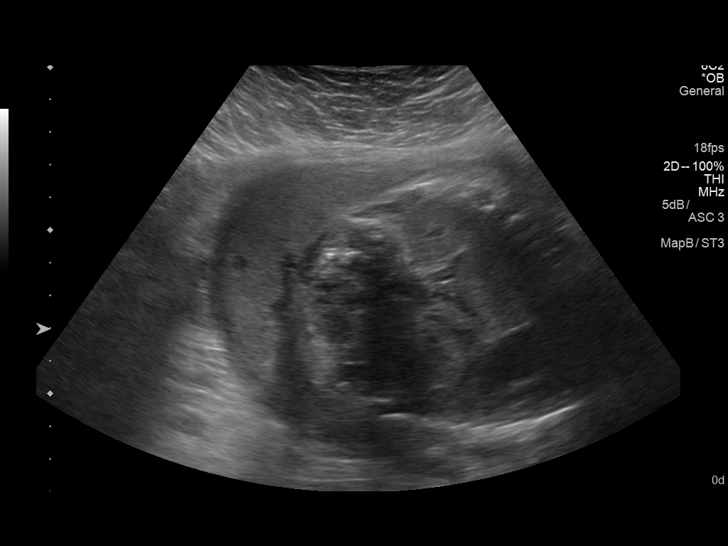
[im 6/17]
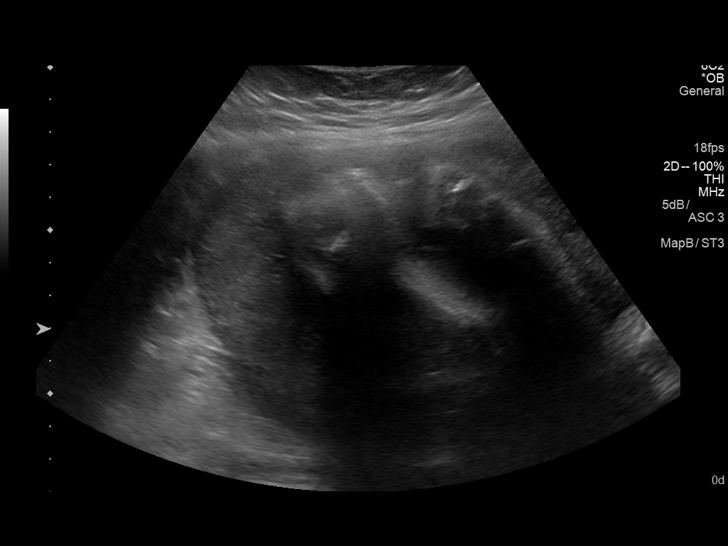
[im 7/17]
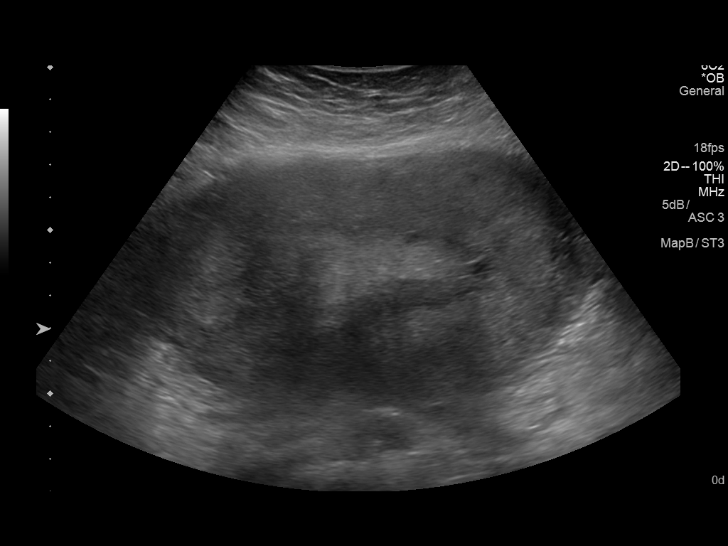
[im 8/17]
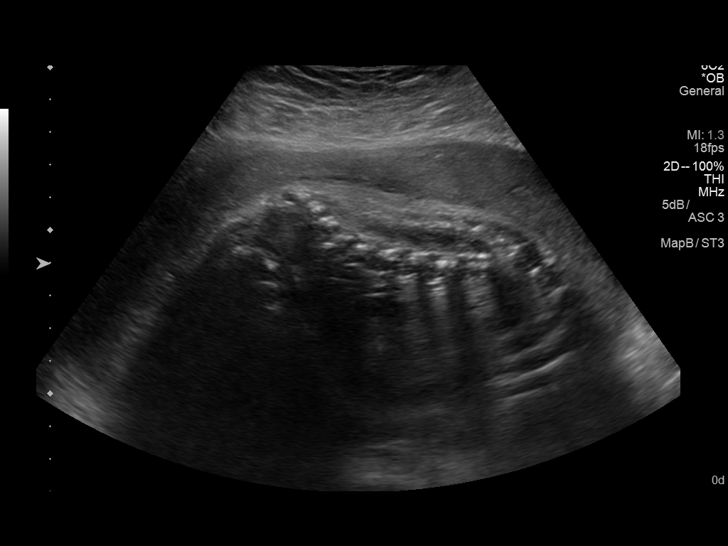
[im 10/17]
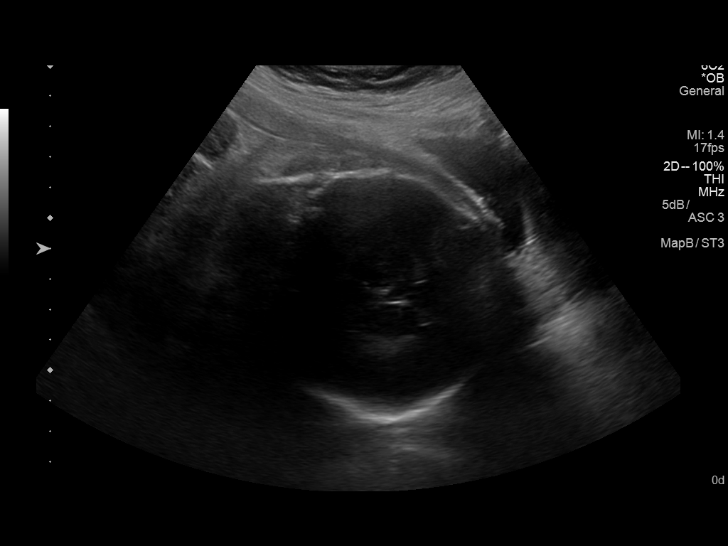
[im 11/17]
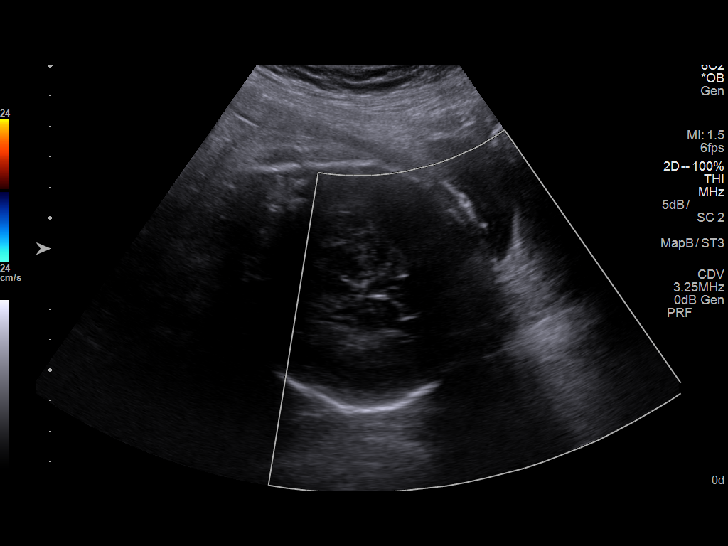
[im 12/17]
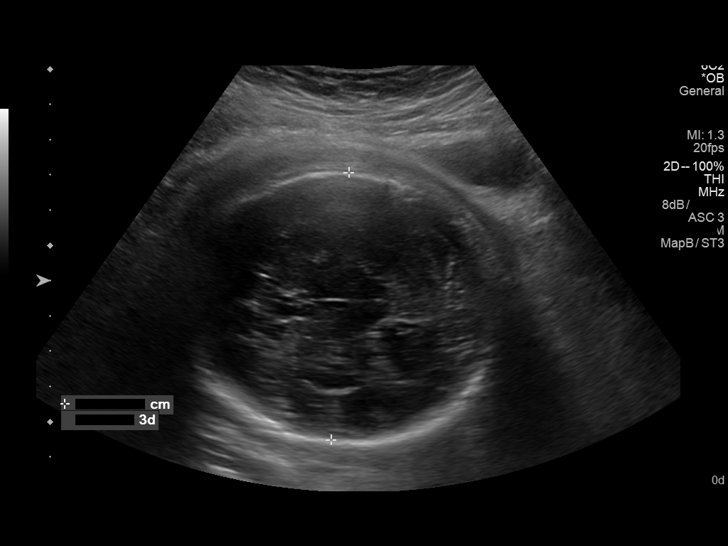
[im 13/17]
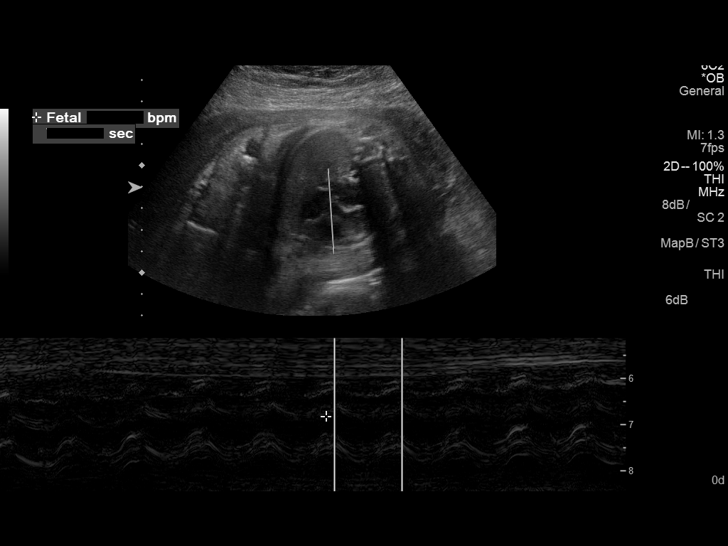
[im 14/17]
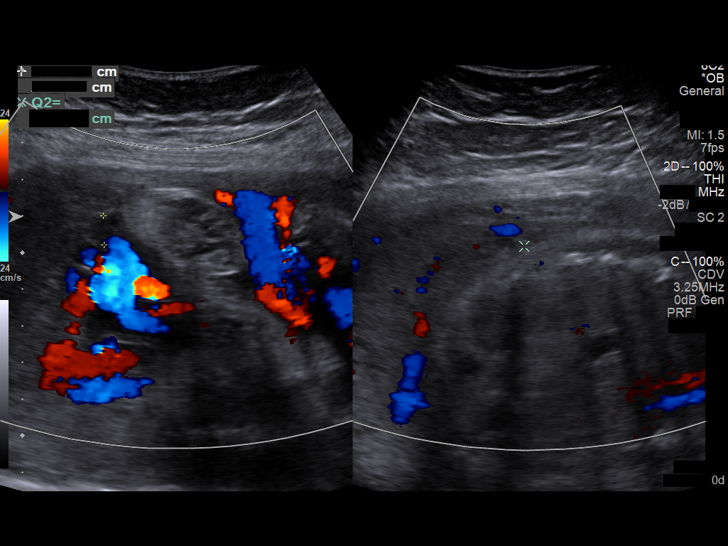
[im 16/17]
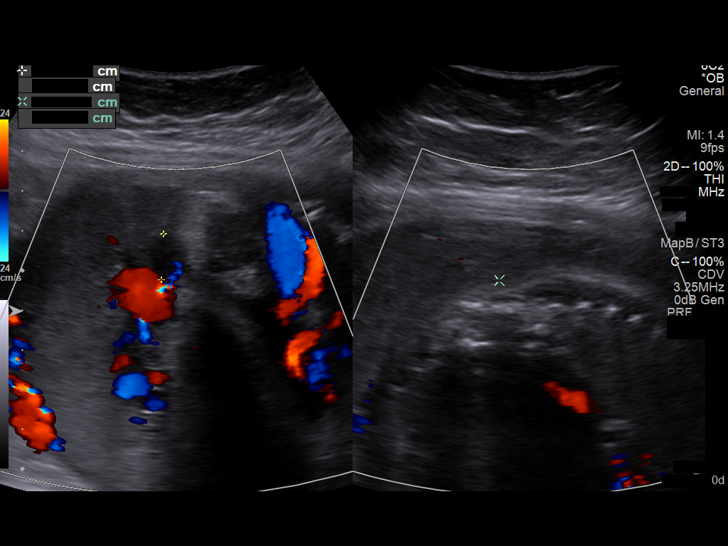
[im 17/17]
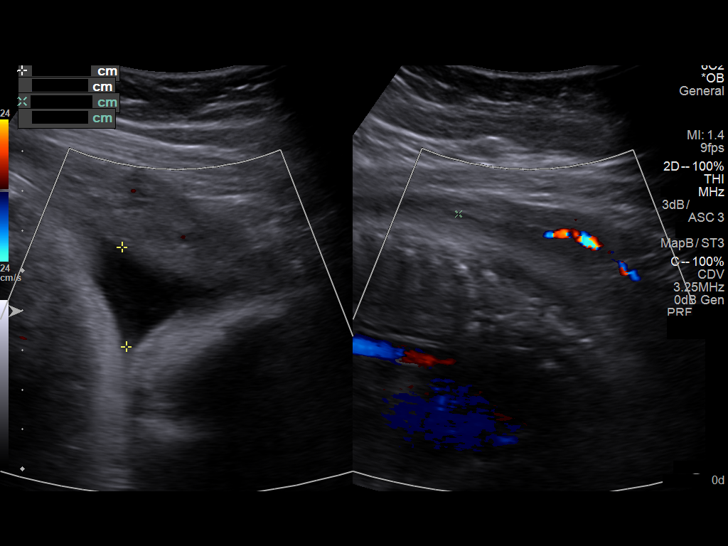

[14 of 17 positions shown; findings below may reference images not displayed]

FINDINGS: Number of Fetuses: 1

Heart Rate:  152 bpm

Movement: Yes

Presentation: Cephalic

Placental Location: Fundal

Previa: No

Amniotic Fluid (Subjective):  Decreased

AFI:  3.7 cm (<2.5 %ile)

BPD:  7.6cm 30w  3d

MATERNAL FINDINGS:

Cervix:  Not visualized.

Uterus/Adnexae: No abnormality visualized.
IMPRESSION: Single living intrauterine fetus in cephalic presentation.

Decreased amniotic fluid volume similar to most recent exam, with
AFI of 3.7 cm.

This exam is performed on an emergent basis and does not
comprehensively evaluate fetal size, dating, or anatomy; follow-up
complete OB US should be considered if further fetal assessment is
warranted.

## 2019-01-06 ENCOUNTER — Telehealth: Payer: Self-pay | Admitting: Certified Nurse Midwife

## 2019-01-06 ENCOUNTER — Other Ambulatory Visit: Payer: Self-pay | Admitting: *Deleted

## 2019-01-06 MED ORDER — TERCONAZOLE 0.4 % VA CREA: 1.0000 | TOPICAL_CREAM | Freq: Every day | VAGINAL | 0 refills | Status: DC

## 2019-01-06 NOTE — Telephone Encounter (Signed)
The patient called to schedule apt for yeast infection, Pt was informed a nurse will review request and notify her what will be done moving forward due to scheduling restrictions/COVID-19. Please advise.

## 2019-06-01 ENCOUNTER — Encounter
Admission: EM | Disposition: A | Discharge status: Home / Self Care | Payer: Self-pay | Source: Home / Self Care | Attending: Emergency Medicine

## 2019-06-01 ENCOUNTER — Emergency Department: Payer: Self-pay | Admitting: Anesthesiology

## 2019-06-01 ENCOUNTER — Other Ambulatory Visit: Payer: Self-pay

## 2019-06-01 ENCOUNTER — Emergency Department
Admission: EM | Admit: 2019-06-01 | Discharge: 2019-06-01 | Disposition: A | Discharge status: Home / Self Care | Payer: Self-pay | Attending: Emergency Medicine | Admitting: Emergency Medicine

## 2019-06-01 ENCOUNTER — Emergency Department: Payer: Self-pay

## 2019-06-01 ENCOUNTER — Encounter: Payer: Self-pay | Admitting: Emergency Medicine

## 2019-06-01 DIAGNOSIS — N939 Abnormal uterine and vaginal bleeding, unspecified: Secondary | ICD-10-CM

## 2019-06-01 DIAGNOSIS — N83201 Unspecified ovarian cyst, right side: Secondary | ICD-10-CM | POA: Insufficient documentation

## 2019-06-01 DIAGNOSIS — Z87891 Personal history of nicotine dependence: Secondary | ICD-10-CM | POA: Insufficient documentation

## 2019-06-01 DIAGNOSIS — Z79899 Other long term (current) drug therapy: Secondary | ICD-10-CM | POA: Insufficient documentation

## 2019-06-01 DIAGNOSIS — R51 Headache: Secondary | ICD-10-CM | POA: Insufficient documentation

## 2019-06-01 DIAGNOSIS — O046 Delayed or excessive hemorrhage following (induced) termination of pregnancy: Secondary | ICD-10-CM | POA: Insufficient documentation

## 2019-06-01 DIAGNOSIS — U071 COVID-19: Secondary | ICD-10-CM | POA: Diagnosis present

## 2019-06-01 DIAGNOSIS — R102 Pelvic and perineal pain: Secondary | ICD-10-CM

## 2019-06-01 DIAGNOSIS — O034 Incomplete spontaneous abortion without complication: Secondary | ICD-10-CM | POA: Diagnosis present

## 2019-06-01 DIAGNOSIS — O074 Failed attempted termination of pregnancy without complication: Secondary | ICD-10-CM

## 2019-06-01 HISTORY — PX: DILATION AND EVACUATION: SHX1459

## 2019-06-01 HISTORY — DX: COVID-19: U07.1

## 2019-06-01 LAB — COMPREHENSIVE METABOLIC PANEL
ALT: 13 U/L (ref 0–44)
AST: 16 U/L (ref 15–41)
Albumin: 4 g/dL (ref 3.5–5.0)
Alkaline Phosphatase: 51 U/L (ref 38–126)
Anion gap: 7 (ref 5–15)
BUN: 6 mg/dL (ref 6–20)
CO2: 24 mmol/L (ref 22–32)
Calcium: 9.1 mg/dL (ref 8.9–10.3)
Chloride: 108 mmol/L (ref 98–111)
Creatinine, Ser: 0.58 mg/dL (ref 0.44–1.00)
GFR calc Af Amer: >60 mL/min (ref >60)
GFR calc non Af Amer: >60 mL/min (ref >60)
Glucose, Bld: 130 mg/dL — ABNORMAL HIGH (ref 70–99)
Potassium: 3.7 mmol/L (ref 3.5–5.1)
Sodium: 139 mmol/L (ref 135–145)
Total Bilirubin: 0.6 mg/dL (ref 0.3–1.2)
Total Protein: 6.6 g/dL (ref 6.5–8.1)

## 2019-06-01 LAB — URINALYSIS, COMPLETE (UACMP) WITH MICROSCOPIC
Bacteria, UA: NONE SEEN
Bilirubin Urine: NEGATIVE
Glucose, UA: NEGATIVE mg/dL
Ketones, ur: NEGATIVE mg/dL
Nitrite: NEGATIVE
Protein, ur: 100 mg/dL — AB
RBC / HPF: >50 RBC/hpf — ABNORMAL HIGH (ref 0–5)
Specific Gravity, Urine: 1.035 — ABNORMAL HIGH (ref 1.005–1.030)
pH: 5 (ref 5.0–8.0)

## 2019-06-01 LAB — CBC
HCT: 34.7 % — ABNORMAL LOW (ref 36.0–46.0)
Hemoglobin: 11 g/dL — ABNORMAL LOW (ref 12.0–15.0)
MCH: 29.2 pg (ref 26.0–34.0)
MCHC: 31.7 g/dL (ref 30.0–36.0)
MCV: 92 fL (ref 80.0–100.0)
Platelets: 247 10*3/uL (ref 150–400)
RBC: 3.77 MIL/uL — ABNORMAL LOW (ref 3.87–5.11)
RDW: 13.7 % (ref 11.5–15.5)
WBC: 5.5 10*3/uL (ref 4.0–10.5)
nRBC: 0 % (ref 0.0–0.2)

## 2019-06-01 LAB — HCG, QUANTITATIVE, PREGNANCY: hCG, Beta Chain, Quant, S: 39 m[IU]/mL — ABNORMAL HIGH (ref <5)

## 2019-06-01 LAB — LIPASE, BLOOD: Lipase: 21 U/L (ref 11–51)

## 2019-06-01 LAB — SARS CORONAVIRUS 2 BY RT PCR (HOSPITAL ORDER, PERFORMED IN ~~LOC~~ HOSPITAL LAB): SARS Coronavirus 2: POSITIVE — AB

## 2019-06-01 SURGERY — DILATION AND EVACUATION, UTERUS
Anesthesia: General

## 2019-06-01 MED ORDER — MIDAZOLAM HCL 2 MG/2ML IJ SOLN
INTRAMUSCULAR | Status: AC
  Filled 2019-06-01: qty 2

## 2019-06-01 MED ORDER — FENTANYL CITRATE (PF) 100 MCG/2ML IJ SOLN
INTRAMUSCULAR | Status: AC
  Filled 2019-06-01: qty 2

## 2019-06-01 MED ORDER — LACTATED RINGERS IV SOLN
INTRAVENOUS | Status: DC
  Administered 2019-06-01 (×2): via INTRAVENOUS

## 2019-06-01 MED ORDER — MIDAZOLAM HCL 2 MG/2ML IJ SOLN
INTRAMUSCULAR | Status: DC | PRN
  Administered 2019-06-01: 2 mg via INTRAVENOUS

## 2019-06-01 MED ORDER — SUGAMMADEX SODIUM 200 MG/2ML IV SOLN
INTRAVENOUS | Status: DC | PRN
  Administered 2019-06-01: 127 mg via INTRAVENOUS

## 2019-06-01 MED ORDER — FENTANYL CITRATE (PF) 100 MCG/2ML IJ SOLN
INTRAMUSCULAR | Status: DC | PRN
  Administered 2019-06-01: 50 ug via INTRAVENOUS
  Administered 2019-06-01 (×2): 25 ug via INTRAVENOUS

## 2019-06-01 MED ORDER — DEXMEDETOMIDINE HCL IN NACL 200 MCG/50ML IV SOLN
INTRAVENOUS | Status: DC | PRN
  Administered 2019-06-01: 8 ug via INTRAVENOUS

## 2019-06-01 MED ORDER — LIDOCAINE HCL (CARDIAC) PF 100 MG/5ML IV SOSY
PREFILLED_SYRINGE | INTRAVENOUS | Status: DC | PRN
  Administered 2019-06-01: 100 mg via INTRAVENOUS

## 2019-06-01 MED ORDER — SODIUM CHLORIDE 0.9 % IV SOLN
1.0000 g | Freq: Once | INTRAVENOUS | Status: AC
  Administered 2019-06-01: 17:00:00 1 g via INTRAVENOUS
  Filled 2019-06-01: qty 10

## 2019-06-01 MED ORDER — ROCURONIUM BROMIDE 100 MG/10ML IV SOLN
INTRAVENOUS | Status: DC | PRN
  Administered 2019-06-01: 40 mg via INTRAVENOUS
  Administered 2019-06-01: 10 mg via INTRAVENOUS

## 2019-06-01 MED ORDER — ONDANSETRON HCL 4 MG/2ML IJ SOLN
INTRAMUSCULAR | Status: DC | PRN
  Administered 2019-06-01: 4 mg via INTRAVENOUS

## 2019-06-01 MED ORDER — SUCCINYLCHOLINE CHLORIDE 20 MG/ML IJ SOLN
INTRAMUSCULAR | Status: DC | PRN
  Administered 2019-06-01: 100 mg via INTRAVENOUS

## 2019-06-01 MED ORDER — NITROFURANTOIN MONOHYD MACRO 100 MG PO CAPS: 100.0000 mg | ORAL_CAPSULE | Freq: Two times a day (BID) | ORAL | 0 refills | Status: AC

## 2019-06-01 MED ORDER — IBUPROFEN 800 MG PO TABS: 800.0000 mg | ORAL_TABLET | Freq: Three times a day (TID) | ORAL | 1 refills | Status: DC | PRN

## 2019-06-01 MED ORDER — PROPOFOL 10 MG/ML IV BOLUS
INTRAVENOUS | Status: DC | PRN
  Administered 2019-06-01: 130 mg via INTRAVENOUS

## 2019-06-01 MED ORDER — DEXAMETHASONE SODIUM PHOSPHATE 10 MG/ML IJ SOLN
INTRAMUSCULAR | Status: DC | PRN
  Administered 2019-06-01: 10 mg via INTRAVENOUS

## 2019-06-01 SURGICAL SUPPLY — 28 items
CATH ROBINSON RED A/P 16FR (CATHETERS) ×3 IMPLANT
COVER WAND RF STERILE (DRAPES) IMPLANT
DRSG TELFA 3X8 NADH (GAUZE/BANDAGES/DRESSINGS) ×3 IMPLANT
FILTER UTR ASPR SPEC (MISCELLANEOUS) ×1 IMPLANT
FLTR UTR ASPR SPEC (MISCELLANEOUS) ×3
GAUZE 4X4 16PLY RFD (DISPOSABLE) ×3 IMPLANT
GLOVE BIO SURGEON STRL SZ 6.5 (GLOVE) ×2 IMPLANT
GLOVE BIO SURGEONS STRL SZ 6.5 (GLOVE) ×1
GLOVE INDICATOR 7.0 STRL GRN (GLOVE) ×3 IMPLANT
GOWN STRL REUS W/ TWL LRG LVL3 (GOWN DISPOSABLE) ×1 IMPLANT
GOWN STRL REUS W/TWL LRG LVL3 (GOWN DISPOSABLE) ×2
KIT BERKELEY 1ST TRIMESTER 3/8 (MISCELLANEOUS) ×3 IMPLANT
KIT TURNOVER KIT A (KITS) ×3 IMPLANT
NDL HYPO 25X1 1.5 SAFETY (NEEDLE) ×1 IMPLANT
NDL SPNL 22GX5 LNG QUINC BK (NEEDLE) ×1 IMPLANT
NEEDLE HYPO 25X1 1.5 SAFETY (NEEDLE) ×3 IMPLANT
NEEDLE SPNL 22GX5 LNG QUINC BK (NEEDLE) ×3 IMPLANT
PACK DNC HYST (MISCELLANEOUS) ×3 IMPLANT
PAD DRESSING TELFA 3X8 NADH (GAUZE/BANDAGES/DRESSINGS) ×1 IMPLANT
PAD OB MATERNITY 4.3X12.25 (PERSONAL CARE ITEMS) ×3 IMPLANT
PAD PREP 24X41 OB/GYN DISP (PERSONAL CARE ITEMS) ×3 IMPLANT
SET BERKELEY SUCTION TUBING (SUCTIONS) ×3 IMPLANT
SOL PREP PVP 2OZ (MISCELLANEOUS) ×3
SOLUTION PREP PVP 2OZ (MISCELLANEOUS) ×1 IMPLANT
SYR 10ML LL (SYRINGE) ×3 IMPLANT
VACURETTE 10 RIGID CVD (CANNULA) IMPLANT
VACURETTE 12 RIGID CVD (CANNULA) IMPLANT
VACURETTE 8 RIGID CVD (CANNULA) IMPLANT

## 2019-06-01 NOTE — Anesthesia Procedure Notes (Signed)
Procedure Name: Intubation Date/Time: 06/01/2019 7:24 PM Performed by: Nelda Marseille, CRNA Pre-anesthesia Checklist: Patient identified, Patient being monitored, Timeout performed, Emergency Drugs available and Suction available Patient Re-evaluated:Patient Re-evaluated prior to induction Oxygen Delivery Method: Circle system utilized Preoxygenation: Pre-oxygenation with 100% oxygen Induction Type: IV induction Ventilation: Mask ventilation without difficulty Laryngoscope Size: Mac, 3 and McGraph Grade View: Grade I Tube type: Oral Tube size: 7.0 mm Number of attempts: 1 Airway Equipment and Method: Stylet Placement Confirmation: ETT inserted through vocal cords under direct vision,  positive ETCO2 and breath sounds checked- equal and bilateral Secured at: 21 cm Tube secured with: Tape Dental Injury: Teeth and Oropharynx as per pre-operative assessment

## 2019-06-01 NOTE — Discharge Instructions (Signed)
Person Under Monitoring Name: Tracy [name]  Location: [address] [place] [id number]   Infection Prevention Recommendations for Individuals Confirmed to have, or Being Evaluated for, 2019 Novel Coronavirus (COVID-19) Infection Who Receive Care at Home  Individuals who are confirmed to have, or are being evaluated for, COVID-19 should follow the prevention steps below until a healthcare provider or local or state health department says they can return to normal activities.  Stay home except to get medical care You should restrict activities outside your home, except for getting medical care. Do not go to work, school, or public areas, and do not use public transportation or taxis.  Call ahead before visiting your doctor Before your medical appointment, call the healthcare provider and tell them that you have, or are being evaluated for, COVID-19 infection. This will help the healthcare providers office take steps to keep other people from getting infected. Ask your healthcare provider to call the local or state health department.  Monitor your symptoms Seek prompt medical attention if your illness is worsening (e.g., difficulty breathing). Before going to your medical appointment, call the healthcare provider and tell them that you have, or are being evaluated for, COVID-19 infection. Ask your healthcare provider to call the local or state health department.  Wear a facemask You should wear a facemask that covers your nose and mouth when you are in the same room with other people and when you visit a healthcare provider. People who live with or visit you should also wear a facemask while they are in the same room with you.  Separate yourself from other people in your home As much as possible, you should stay in a different room from other people in your home. Also, you should use a separate bathroom, if available.  Avoid sharing household items You should not  share dishes, drinking glasses, cups, eating utensils, towels, bedding, or other items with other people in your home. After using these items, you should wash them thoroughly with soap and water.  Cover your coughs and sneezes Cover your mouth and nose with a tissue when you cough or sneeze, or you can cough or sneeze into your sleeve. Throw used tissues in a lined trash can, and immediately wash your hands with soap and water for at least 20 seconds or use an alcohol-based hand rub.  Wash your [employer]hands Wash your hands often and thoroughly with soap and water for at least 20 seconds. You can use an alcohol-based hand sanitizer if soap and water are not available and if your hands are not visibly dirty. Avoid touching your eyes, nose, and mouth with unwashed hands.   Prevention Steps for Caregivers and Household Members of Individuals Confirmed to have, or Being Evaluated for, COVID-19 Infection Being Cared for in the Home  If you live with, or provide care at home for, a person confirmed to have, or being evaluated for, COVID-19 infection please follow these guidelines to prevent infection:  Follow healthcare providers instructions Make sure that you understand and can help the patient follow any healthcare provider instructions for all care.  Provide for the patients basic needs You should help the patient with basic needs in the home and provide support for getting groceries, prescriptions, and other personal needs.  Monitor the patients symptoms If they are getting sicker, call his or her medical provider and tell them that the patient has, or is being evaluated for, COVID-19 infection. This will help the healthcare providers office  take steps to keep other people from getting infected. Ask the healthcare provider to call the local or state health department.  Limit the number of people who have contact with the patient  If possible, have only one caregiver for the  patient.  Other household members should stay in another home or place of residence. If this is not possible, they should stay  in another room, or be separated from the patient as much as possible. Use a separate bathroom, if available.  Restrict visitors who do not have an essential need to be in the home.  Keep older adults, very young children, and other sick people away from the patient Keep older adults, very young children, and those who have compromised immune systems or chronic health conditions away from the patient. This includes people with chronic heart, lung, or kidney conditions, diabetes, and cancer.  Ensure good ventilation Make sure that shared spaces in the home have good air flow, such as from an air conditioner or an opened window, weather permitting.  Wash your hands often  Wash your hands often and thoroughly with soap and water for at least 20 seconds. You can use an alcohol based hand sanitizer if soap and water are not available and if your hands are not visibly dirty.  Avoid touching your eyes, nose, and mouth with unwashed hands.  Use disposable paper towels to dry your hands. If not available, use dedicated cloth towels and replace them when they become wet.  Wear a facemask and gloves  Wear a disposable facemask at all times in the room and gloves when you touch or have contact with the patients blood, body fluids, and/or secretions or excretions, such as sweat, saliva, sputum, nasal mucus, vomit, urine, or feces.  Ensure the mask fits over your nose and mouth tightly, and do not touch it during use.  Throw out disposable facemasks and gloves after using them. Do not reuse.  Wash your hands immediately after removing your facemask and gloves.  If your personal clothing becomes contaminated, carefully remove clothing and launder. Wash your hands after handling contaminated clothing.  Place all used disposable facemasks, gloves, and other waste in a lined  container before disposing them with other household waste.  Remove gloves and wash your hands immediately after handling these items.  Do not share dishes, glasses, or other household items with the patient  Avoid sharing household items. You should not share dishes, drinking glasses, cups, eating utensils, towels, bedding, or other items with a patient who is confirmed to have, or being evaluated for, COVID-19 infection.  After the person uses these items, you should wash them thoroughly with soap and water.  Wash laundry thoroughly  Immediately remove and wash clothes or bedding that have blood, body fluids, and/or secretions or excretions, such as sweat, saliva, sputum, nasal mucus, vomit, urine, or feces, on them.  Wear gloves when handling laundry from the patient.  Read and follow directions on labels of laundry or clothing items and detergent. In general, wash and dry with the warmest temperatures recommended on the label.  Clean all areas the individual has used often  Clean all touchable surfaces, such as counters, tabletops, doorknobs, bathroom fixtures, toilets, phones, keyboards, tablets, and bedside tables, every day. Also, clean any surfaces that may have blood, body fluids, and/or secretions or excretions on them.  Wear gloves when cleaning surfaces the patient has come in contact with.  Use a diluted bleach solution (e.g., dilute bleach with 1 part  bleach and 10 parts water) or a household disinfectant with a label that says EPA-registered for coronaviruses. To make a bleach solution at home, add 1 tablespoon of bleach to 1 quart (4 cups) of water. For a larger supply, add  cup of bleach to 1 gallon (16 cups) of water.  Read labels of cleaning products and follow recommendations provided on product labels. Labels contain instructions for safe and effective use of the cleaning product including precautions you should take when applying the product, such as wearing gloves or  eye protection and making sure you have good ventilation during use of the product.  Remove gloves and wash hands immediately after cleaning.  Monitor yourself for signs and symptoms of illness Caregivers and household members are considered close contacts, should monitor their health, and will be asked to limit movement outside of the home to the extent possible. Follow the monitoring steps for close contacts listed on the symptom monitoring form.   ? If you have additional questions, contact your local health department or call the epidemiologist on call at [phone] (available 24/7). ? This guidance is subject to change. For the most up-to-date guidance from [facility], please refer to their website: TripMetro.huhttps://www.cdc.gov/coronavirus/2019-ncov/hcp/guidance-prevent-spread.html   Dilation and Curettage or Vacuum Curettage, Care After This sheet gives you information about how to care for yourself after your procedure. Your health care provider may also give you more specific instructions. If you have problems or questions, contact your health care provider. What can I expect after the procedure? After your procedure, it is common to have:  Mild pain or cramping.  Some vaginal bleeding or spotting. These may last for up to 2 weeks after your procedure. Follow these instructions at home: Activity   Do not drive or use heavy machinery while taking prescription pain medicine.  Avoid driving for the first 24 hours after your procedure.  Take frequent, short walks, followed by rest periods, throughout the day. Ask your health care provider what activities are safe for you. After 1-2 days, you may be able to return to your normal activities.  Do not lift anything heavier than 10 lb (4.5 kg) until your health care provider approves.  For at least 2 weeks, or as long as told by your health care provider, do not: ? Douche. ? Use tampons. ? Have sexual intercourse. General  instructions   Take over-the-counter and prescription medicines only as told by your health care provider. This is especially important if you take blood thinning medicine.  Do not take baths, swim, or use a hot tub until your health care provider approves. Take showers instead of baths.  Wear compression stockings as told by your health care provider. These stockings help to prevent blood clots and reduce swelling in your legs.  It is your responsibility to get the results of your procedure. Ask your health care provider, or the department performing the procedure, when your results will be ready.  Keep all follow-up visits as told by your health care provider. This is important. Contact a health care provider if:  You have severe cramps that get worse or that do not get better with medicine.  You have severe abdominal pain.  You cannot drink fluids without vomiting.  You develop pain in a different area of your pelvis.  You have bad-smelling vaginal discharge.  You have a rash. Get help right away if:  You have vaginal bleeding that soaks more than one sanitary pad in 1 hour, for 2 hours in  a row.  You pass large blood clots from your vagina.  You have a fever that is above 100.78F (38.0C).  Your abdomen feels very tender or hard.  You have chest pain.  You have shortness of breath.  You cough up blood.  You feel dizzy or light-headed.  You faint.  You have pain in your neck or shoulder area. This information is not intended to replace advice given to you by your health care provider. Make sure you discuss any questions you have with your health care provider. Document Released: 09/22/2000 Document Revised: 09/07/2017 Document Reviewed: 04/27/2016 Elsevier Patient Education  2020 [place].    Urinary Tract Infection, Adult A urinary tract infection (UTI) is an infection of any part of the urinary tract. The urinary tract includes:  The kidneys.  The  ureters.  The bladder.  The urethra. These organs make, store, and get rid of pee (urine) in the body. What are the causes? This is caused by germs (bacteria) in your genital area. These germs grow and cause swelling (inflammation) of your urinary tract. What increases the risk? You are more likely to develop this condition if:  You have a small, thin tube (catheter) to drain pee.  You cannot control when you pee or poop (incontinence).  You are female, and: ? You use these methods to prevent pregnancy: ? A medicine that kills sperm (spermicide). ? A device that blocks sperm (diaphragm). ? You have low levels of a female hormone (estrogen). ? You are pregnant.  You have genes that add to your risk.  You are sexually active.  You take antibiotic medicines.  You have trouble peeing because of: ? A prostate that is bigger than normal, if you are female. ? A blockage in the part of your body that drains pee from the bladder (urethra). ? A kidney stone. ? A nerve condition that affects your bladder (neurogenic bladder). ? Not getting enough to drink. ? Not peeing often enough.  You have other conditions, such as: ? Diabetes. ? A weak disease-fighting system (immune system). ? Sickle cell disease. ? Gout. ? Injury of the spine. What are the signs or symptoms? Symptoms of this condition include:  Needing to pee right away (urgently).  Peeing often.  Peeing small amounts often.  Pain or burning when peeing.  Blood in the pee.  Pee that smells bad or not like normal.  Trouble peeing.  Pee that is cloudy.  Fluid coming from the vagina, if you are female.  Pain in the belly or lower back. Other symptoms include:  Throwing up (vomiting).  No urge to eat.  Feeling mixed up (confused).  Being tired and grouchy (irritable).  A fever.  Watery poop (diarrhea). How is this treated? This condition may be treated with:  Antibiotic medicine.  Other  medicines.  Drinking enough water. Follow these instructions at home:  Medicines  Take over-the-counter and prescription medicines only as told by your doctor.  If you were prescribed an antibiotic medicine, take it as told by your doctor. Do not stop taking it even if you start to feel better. General instructions  Make sure you: ? Pee until your bladder is empty. ? Do not hold pee for a long time. ? Empty your bladder after sex. ? Wipe from front to back after pooping if you are a female. Use each tissue one time when you wipe.  Drink enough fluid to keep your pee pale yellow.  Keep all follow-up visits  as told by your doctor. This is important. Contact a doctor if:  You do not get better after 1-2 days.  Your symptoms go away and then come back. Get help right away if:  You have very bad back pain.  You have very bad pain in your lower belly.  You have a fever.  You are sick to your stomach (nauseous).  You are throwing up. Summary  A urinary tract infection (UTI) is an infection of any part of the urinary tract.  This condition is caused by germs in your genital area.  There are many risk factors for a UTI. These include having a small, thin tube to drain pee and not being able to control when you pee or poop.  Treatment includes antibiotic medicines for germs.  Drink enough fluid to keep your pee pale yellow. This information is not intended to replace advice given to you by your health care provider. Make sure you discuss any questions you have with your health care provider. Document Released: 03/13/2008 Document Revised: 09/12/2018 Document Reviewed: 04/04/2018 Elsevier Patient Education  2020 [employer]   AMBULATORY SURGERY  DISCHARGE INSTRUCTIONS   1) The drugs that you were given will stay in your system until tomorrow so for the next 24 hours you should not:  A) Drive an automobile B) Make any legal decisions C) Drink any alcoholic  beverage   2) You may resume regular meals tomorrow.  Today it is better to start with liquids and gradually work up to solid foods.  You may eat anything you prefer, but it is better to start with liquids, then soup and crackers, and gradually work up to solid foods.   3) Please notify your doctor immediately if you have any unusual bleeding, trouble breathing, redness and pain at the surgery site, drainage, fever, or pain not relieved by medication.    4) Additional Instructions:        Please contact your physician with any problems or Same Day Surgery at [phone], Monday through Friday 6 am to 4 pm, or [place] [facility] Main number at [phone].

## 2019-06-01 NOTE — ED Notes (Signed)
Pt given blanket and cup of water,

## 2019-06-01 NOTE — Anesthesia Post-op Follow-up Note (Signed)
Anesthesia QCDR form completed.        

## 2019-06-01 NOTE — ED Provider Notes (Signed)
[facility] Emergency Department Provider Note       Time seen: ----------------------------------------- 1:43 PM on 06/01/2019 -----------------------------------------   I have reviewed the triage vital signs and the nursing notes.  HISTORY   Chief Complaint Vaginal Bleeding   HPI Tracy [name] is a 23 y.o. female with a history of headaches, irregular menses who presents to the ED for persistent vaginal bleeding for the last month.  Patient reports taking a medication to induce abortion on July 22.  She has been having bleeding but no cramping or pain until last night where it was severe.  She has been passing large clots, denies fevers, nausea, vomiting or other complaints.  Past Medical History:  Diagnosis Date  . Abortion history   . Headache    chronic and migraines  . Heart murmur   . Irregular periods    history    Patient Active Problem List   Diagnosis Date Noted  . Limited prenatal care 03/10/2017  . Late prenatal care affecting pregnancy in second trimester 03/10/2017  . Preterm premature rupture of membranes (PPROM) with unknown onset of labor 03/09/2017    Past Surgical History:  Procedure Laterality Date  . adnoids    . WISDOM TOOTH EXTRACTION      Allergies Patient has no known allergies.  Social History Social History   Tobacco Use  . Smoking status: Former Games developermoker  . Smokeless tobacco: Never Used  Substance Use Topics  . Alcohol use: No  . Drug use: No   Review of Systems Constitutional: Negative for fever. Cardiovascular: Negative for chest pain. Respiratory: Negative for shortness of breath. Gastrointestinal: Negative for abdominal pain, vomiting and diarrhea. Genitourinary: Positive for vaginal bleeding and pelvic pain Musculoskeletal: Negative for back pain. Skin: Negative for rash. Neurological: Negative for headaches, focal weakness or numbness.  All systems negative/normal/unremarkable except as  stated in the HPI  ____________________________________________   PHYSICAL EXAM:  VITAL SIGNS: ED Triage Vitals  Enc Vitals Group     BP 06/01/19 1253 126/75     Pulse Rate 06/01/19 1253 (!) 116     Resp 06/01/19 1253 16     Temp 06/01/19 1253 99 F (37.2 C)     Temp Source 06/01/19 1253 Oral     SpO2 06/01/19 1253 100 %     Weight 06/01/19 1254 140 lb (63.5 kg)     Height 06/01/19 1254 5\' 1"  (1.549 m)     Head Circumference --      Peak Flow --      Pain Score 06/01/19 1253 5     Pain Loc --      Pain Edu? --      Excl. in GC? --    Constitutional: Alert and oriented. Well appearing and in no distress. Eyes: Conjunctivae are normal. Normal extraocular movements. Cardiovascular: Normal rate, regular rhythm. No murmurs, rubs, or gallops. Respiratory: Normal respiratory effort without tachypnea nor retractions. Breath sounds are clear and equal bilaterally. No wheezes/rales/rhonchi. Gastrointestinal: Soft and nontender. Normal bowel sounds Musculoskeletal: Nontender with normal range of motion in extremities. No lower extremity tenderness nor edema. Neurologic:  Normal speech and language. No gross focal neurologic deficits are appreciated.  Skin:  Skin is warm, dry and intact. No rash noted. Psychiatric: Mood and affect are normal. Speech and behavior are normal.   ____________________________________________  ED COURSE:  As part of my medical decision making, I reviewed the following data within the electronic MEDICAL RECORD NUMBER History obtained from  family if available, nursing notes, old chart and ekg, as well as notes from prior ED visits. Patient presented for pelvic pain and vaginal bleeding, we will assess with labs and imaging as indicated at this time.   Procedures  Tracy [name] was evaluated in Emergency Department on 06/01/2019 for the symptoms described in the history of present illness. She was evaluated in the context of the global COVID-19 pandemic, which  necessitated consideration that the patient might be at risk for infection with the SARS-CoV-2 virus that causes COVID-19. Institutional protocols and algorithms that pertain to the evaluation of patients at risk for COVID-19 are in a state of rapid change based on information released by regulatory bodies including the CDC and federal and state organizations. These policies and algorithms were followed during the patient's care in the ED.  ____________________________________________   LABS (pertinent positives/negatives)  Labs Reviewed  COMPREHENSIVE METABOLIC PANEL - Abnormal; Notable for the following components:      Result Value   Glucose, Bld 130 (*)    All other components within normal limits  CBC - Abnormal; Notable for the following components:   RBC 3.77 (*)    Hemoglobin 11.0 (*)    HCT 34.7 (*)    All other components within normal limits  URINALYSIS, COMPLETE (UACMP) WITH MICROSCOPIC - Abnormal; Notable for the following components:   Color, Urine YELLOW (*)    APPearance HAZY (*)    Specific Gravity, Urine 1.035 (*)    Hgb urine dipstick LARGE (*)    Protein, ur 100 (*)    Leukocytes,Ua SMALL (*)    RBC / HPF >50 (*)    All other components within normal limits  HCG, QUANTITATIVE, PREGNANCY - Abnormal; Notable for the following components:   hCG, Beta Chain, Quant, S 39 (*)    All other components within normal limits  CHLAMYDIA/NGC RT PCR ([facility] ONLY)  LIPASE, BLOOD    RADIOLOGY Images were viewed by me  Pelvic ultrasound Is concerning for retained products of conception ____________________________________________   DIFFERENTIAL DIAGNOSIS   Retained products of conception, dysmenorrhea, anemia  FINAL ASSESSMENT AND PLAN  Abnormal vaginal bleeding, retained products of conception   Plan: The patient had presented for persistent vaginal bleeding after an abortion 1 month ago. Patient's labs are grossly unremarkable except for possible UTI.  Patient's imaging is concerning for retained products of conception.  I have discussed with GYN for evaluation and possible D&C.   Laurence Aly, MD    Note: This note was generated in part or whole with voice recognition software. Voice recognition is usually quite accurate but there are transcription errors that can and very often do occur. I apologize for any typographical errors that were not detected and corrected.     Earleen Newport, MD 06/01/19 [phone]

## 2019-06-01 NOTE — Progress Notes (Signed)
PACU and post op performed in OR due to pt positive for covid.

## 2019-06-01 NOTE — H&P (Signed)
Reason for Consult: vaginal bleeding, retained products of conception Referring Physician: Daryel NovemberJonathan Williams, MD (ER Physician)  Tracy AntuRachel M [name] is an 23 y.o. [phone] female who presented to the Emergency Room for complaints of persistent heavy vaginal bleeding with passage of large clots, s/p medical termination of pregnancy at [redacted] weeks gestation last month.  Patient notes that she never stopped bleeding after the miscarriage. Had not had any further cramping or pain until last night when pain became severe. Notes feeling pain in lower abdomen and in her back. She reports feeling more fatigued lately, but otherwise denies any fevers, chills, dizziness, or shortness of breath.   Pertinent Gynecological History: Menses: history of irreglar cycles.  LMP sometime in early June, cannot recall exact date.  Bleeding: abnormal uterine bleeding Contraception: OCP (estrogen/progesterone) Blood transfusions: none Sexually transmitted diseases: no past history Previous GYN Procedures: None  Last pap: normal Date: 12/2016   OB History  Gravida Para Term Preterm AB Living  3 1   1 2 1   SAB TAB Ectopic Multiple Live Births    2   0 1    # Outcome Date GA Lbr Len/2nd Weight Sex Delivery Anes PTL Lv  3 TAB 2020          2 Preterm 03/16/17 3231w2d / 00:21 1560 g F Vag-Spont None  LIV  1 TAB 2016        ND     Past Medical History:  Diagnosis Date  . Abortion history   . Headache    chronic and migraines  . Heart murmur   . Irregular periods    history    Past Surgical History:  Procedure Laterality Date  . adnoids    . WISDOM TOOTH EXTRACTION      Family History  Problem Relation Age of Onset  . Hypertension Maternal Grandmother     Social History:  reports that she has quit smoking. She has never used smokeless tobacco. She reports that she does not drink alcohol or use drugs.  Allergies: No Known Allergies  Medications: I have reviewed the patient's current medications. No  current facility-administered medications on file prior to encounter.    Current Outpatient Medications on File Prior to Encounter  Medication Sig Dispense Refill  . norethindrone (MICRONOR,CAMILA,ERRIN) 0.35 MG tablet Take 1 tablet (0.35 mg total) by mouth daily. 1 Package 11  . ibuprofen (ADVIL) 800 MG tablet Take 800 mg by mouth every 8 (eight) hours as needed for pain.    Marland Kitchen. ibuprofen (ADVIL,MOTRIN) 600 MG tablet Take 1 tablet (600 mg total) by mouth every 6 (six) hours. (Patient not taking: Reported on 06/01/2019) 30 tablet 0  . Prenatal Vit-Fe Fumarate-FA (PRENATAL 1+1 PO) Take 1 tablet by mouth daily.    . promethazine (PHENERGAN) 25 MG tablet Take 25 mg by mouth every 4 (four) hours as needed for nausea.    Marland Kitchen. terconazole (TERAZOL 7) 0.4 % vaginal cream Place 1 applicator vaginally at bedtime. (Patient not taking: Reported on 06/01/2019) 45 g 0     Review of Systems  Constitutional: Positive for malaise/fatigue. Negative for chills and fever.  HENT: Negative.   Eyes: Negative.   Respiratory: Negative for cough, shortness of breath and wheezing.   Cardiovascular: Negative for chest pain, palpitations and leg swelling.  Gastrointestinal: Positive for abdominal pain. Negative for constipation, diarrhea, nausea and vomiting.  Genitourinary: Negative for dysuria, frequency, hematuria and urgency.  Musculoskeletal: Negative.   Skin: Negative.   Neurological: Negative.   Endo/Heme/Allergies:  Negative.   Psychiatric/Behavioral: Negative.     Blood pressure 126/75, pulse (!) 116, temperature 99 F (37.2 C), temperature source Oral, resp. rate 16, height 5\' 1"  (1.549 m), weight 63.5 kg, SpO2 100 %, unknown if currently breastfeeding. Physical Exam  Constitutional: She is oriented to person, place, and time. She appears well-developed and well-nourished. No distress.  HENT:  Head: Normocephalic and atraumatic.  Mouth/Throat: No oropharyngeal exudate.  Eyes: Pupils are equal, round, and  reactive to light. Conjunctivae and EOM are normal. No scleral icterus.  Neck: Normal range of motion. Neck supple. No thyromegaly present.  Cardiovascular: Regular rhythm. Exam reveals no gallop and no friction rub.  No murmur heard. Mild tachycardia present.  Respiratory: Effort normal and breath sounds normal. No respiratory distress. She has no wheezes. She exhibits no tenderness.  GI: Soft. Bowel sounds are normal. She exhibits no distension. There is no abdominal tenderness. There is no rebound and no guarding.  Genitourinary:    Genitourinary Comments: Deferred to OR. Can refer to Dr. Mayford KnifeWilliams initial ER assessment also.   Musculoskeletal: Normal range of motion.        General: No tenderness, deformity or edema.  Neurological: She is alert and oriented to person, place, and time.  Skin: Skin is warm and dry. No rash noted. No erythema. No pallor.  Psychiatric: She has a normal mood and affect. Her behavior is normal.    Results for orders placed or performed during the hospital encounter of 06/01/19 (from the past 48 hour(s))  Lipase, blood     Status: None   Collection Time: 06/01/19  1:03 PM  Result Value Ref Range   Lipase 21 11 - 51 U/L    Comment: Performed at [facility], [address] [place], [place] [address]  Comprehensive metabolic panel     Status: Abnormal   Collection Time: 06/01/19  1:03 PM  Result Value Ref Range   Sodium 139 135 - 145 mmol/L   Potassium 3.7 3.5 - 5.1 mmol/L   Chloride 108 98 - 111 mmol/L   CO2 24 22 - 32 mmol/L   Glucose, Bld 130 (H) 70 - 99 mg/dL   BUN 6 6 - 20 mg/dL   Creatinine, Ser 6.040.58 0.44 - 1.00 mg/dL   Calcium 9.1 8.9 - 54.010.3 mg/dL   Total Protein 6.6 6.5 - 8.1 g/dL   Albumin 4.0 3.5 - 5.0 g/dL   AST 16 15 - 41 U/L   ALT 13 0 - 44 U/L   Alkaline Phosphatase 51 38 - 126 U/L   Total Bilirubin 0.6 0.3 - 1.2 mg/dL   GFR calc non Af Amer >60 >60 mL/min   GFR calc Af Amer >60 >60 mL/min   Anion gap 7 5 - 15    Comment:  Performed at [facility], [address] [place], [place] [address]  CBC     Status: Abnormal   Collection Time: 06/01/19  1:03 PM  Result Value Ref Range   WBC 5.5 4.0 - 10.5 K/uL   RBC 3.77 (L) 3.87 - 5.11 MIL/uL   Hemoglobin 11.0 (L) 12.0 - 15.0 g/dL   HCT 14.734.7 (L) 82.936.0 - 56.246.0 %   MCV 92.0 80.0 - 100.0 fL   MCH 29.2 26.0 - 34.0 pg   MCHC 31.7 30.0 - 36.0 g/dL   RDW 13.013.7 86.511.5 - 78.415.5 %   Platelets 247 150 - 400 K/uL   nRBC 0.0 0.0 - 0.2 %    Comment:  Performed at [facility], [address] [place], [place] [address]  hCG, quantitative, pregnancy     Status: Abnormal   Collection Time: 06/01/19  1:03 PM  Result Value Ref Range   hCG, Beta Chain, Quant, S 39 (H) <5 mIU/mL    Comment:          GEST. AGE      CONC.  (mIU/mL)   <=1 WEEK        5 - 50     2 WEEKS       50 - 500     3 WEEKS       100 - 10,000     4 WEEKS     1,000 - 30,000     5 WEEKS     3,500 - 115,000   6-8 WEEKS     12,000 - 270,000    12 WEEKS     15,000 - 220,000        FEMALE AND NON-PREGNANT FEMALE:     LESS THAN 5 mIU/mL Performed at [facility], [address] [place], [place] [address]   Urinalysis, Complete w Microscopic     Status: Abnormal   Collection Time: 06/01/19  1:05 PM  Result Value Ref Range   Color, Urine YELLOW (A) YELLOW   APPearance HAZY (A) CLEAR   Specific Gravity, Urine 1.035 (H) 1.005 - 1.030   pH 5.0 5.0 - 8.0   Glucose, UA NEGATIVE NEGATIVE mg/dL   Hgb urine dipstick LARGE (A) NEGATIVE   Bilirubin Urine NEGATIVE NEGATIVE   Ketones, ur NEGATIVE NEGATIVE mg/dL   Protein, ur 403100 (A) NEGATIVE mg/dL   Nitrite NEGATIVE NEGATIVE   Leukocytes,Ua SMALL (A) NEGATIVE   RBC / HPF >50 (H) 0 - 5 RBC/hpf   WBC, UA 11-20 0 - 5 WBC/hpf   Bacteria, UA NONE SEEN NONE SEEN   Squamous Epithelial / LPF 0-5 0 - 5   Mucus PRESENT     Comment: Performed at [facility], [address] [place], [place] [address]    [place] Pelvic Complete W Transvaginal  And Torsion R/o  Result Date: 06/01/2019 CLINICAL DATA:  Pelvic pain, postabortion 1 month ago. Question retained products of conception EXAM: TRANSABDOMINAL AND TRANSVAGINAL ULTRASOUND OF PELVIS DOPPLER ULTRASOUND OF OVARIES TECHNIQUE: Both transabdominal and transvaginal ultrasound examinations of the pelvis were performed. Transabdominal technique was performed for global imaging of the pelvis including uterus, ovaries, adnexal regions, and pelvic cul-de-sac. It was necessary to proceed with endovaginal exam following the transabdominal exam to visualize the uterus, endometrium, ovaries and adnexa. Color and duplex Doppler ultrasound was utilized to evaluate blood flow to the ovaries. COMPARISON:  None FINDINGS: Uterus Measurements: 7.5 x 4.3 x 5.3 cm = volume: 89 mL. No fibroids or other mass visualized. Endometrium Thickness: 14 mm in thickness, heterogeneous. Areas of internal blood flow. Right ovary Measurements: 4.7 x 4.1 x 3.9 cm = volume: 40 mL. 4.2 cm cyst appears simple Left ovary Measurements: 3.3 x 2.6 x 1.6 cm = volume: 7 mL. Normal appearance/no adnexal mass. Pulsed Doppler evaluation of both ovaries demonstrates normal low-resistance arterial and venous waveforms. Other findings No abnormal free fluid. IMPRESSION: Thickened, heterogeneous endometrium with some areas of internal blood flow. Findings concerning for retained products of conception. 4 cm simple appearing right ovarian cyst Electronically Signed   By: Charlett NoseKevin  Dover M.D.   On: 06/01/2019 15:40    Assessment/Plan: 1. Retained products of conception - reviewed ultrasound findings with  patient. Discussed that as overall her labs are stable, she had the option of medical management with Misoprostol to evacuate the clots and remaining tissue, or proceed to surgical management with a Dilation and Curettage. Discussed risks/benefits of each.  Patient opts for surgical management with D&C. Discussed risks of surgery, including risk of  infection, uterine perforation, and bleeding. Patient notes understanding. Signed consent form. Has been NPO since ~ 1000 this morning, to remain NPO for procedure. Notified OR of patient's need for surgery.   2. Suspected UTI - Patient currently receiving a dose of Ceftriaxone to treat suspected UTI. Urine culture has been sent. 3. Simple cyst of right ovary - patient otherwise asymptomatic.  Can resume use of OCPs after surgery that can help resolve cyst, or will eventually self-resolve.     Rubie Maid, MD [facility] 06/01/2019

## 2019-06-01 NOTE — ED Triage Notes (Signed)
Pt arrived via POV with reports of taking medication for an abortion about 1 month ago on 04/30/19, pt states she has been having heavy vaginal bleeding as well as lower abdominal pain. Pt states the lower abdominal pain started last night and c/o low back pain as well.  Pt states she is passing large clots.  Pt goes to [facility] for GYN care.  Pt denies N/V.

## 2019-06-01 NOTE — ED Notes (Signed)
Pt back from US

## 2019-06-01 NOTE — Anesthesia Preprocedure Evaluation (Addendum)
Anesthesia Evaluation  Patient identified by MRN, date of birth, ID band Patient awake    Reviewed: Allergy & Precautions, NPO status , Patient's Chart, lab work & pertinent test results  Airway Mallampati: II  TM Distance: >3 FB     Dental  (+) Teeth Intact   Pulmonary former smoker,    Pulmonary exam normal        Cardiovascular Normal cardiovascular exam+ Valvular Problems/Murmurs      Neuro/Psych  Headaches, negative psych ROS   GI/Hepatic negative GI ROS, Neg liver ROS,   Endo/Other  negative endocrine ROS  Renal/GU negative Renal ROS  negative genitourinary   Musculoskeletal negative musculoskeletal ROS (+)   Abdominal Normal abdominal exam  (+)   Peds negative pediatric ROS (+)  Hematology negative hematology ROS (+)   Anesthesia Other Findings   Reproductive/Obstetrics                             Anesthesia Physical Anesthesia Plan  ASA: I and emergent  Anesthesia Plan: General   Post-op Pain Management:    Induction: Intravenous  PONV Risk Score and Plan:   Airway Management Planned: Oral ETT and LMA  Additional Equipment:   Intra-op Plan:   Post-operative Plan: Extubation in OR  Informed Consent: I have reviewed the patients History and Physical, chart, labs and discussed the procedure including the risks, benefits and alternatives for the proposed anesthesia with the patient or authorized representative who has indicated his/her understanding and acceptance.     Dental advisory given  Plan Discussed with: CRNA and Surgeon  Anesthesia Plan Comments:        Anesthesia Quick Evaluation

## 2019-06-01 NOTE — Op Note (Signed)
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 Rubie Maid ASSISTANT: None ANESTHESIA: General  INDICATIONS: 23 y.o. [id number] with history ofretained products of conception ~ 1 month s/p medical termination of pregnancy at [redacted] weeks gestation presents for surgical management.   Please see preoperative notes for further details.   FINDINGS:  Uterus was midplane and 8 week size. Tissue consistent with products of conception and blood clots were removed and sent to Pathology.   I/O's: Total I/O In: -  Out: 75 [Urine:25; Blood:50]  SPECIMENS: POC  COMPLICATIONS: None immediate COUNTS:  YES  PROCEDURE IN DETAIL: The patient was brought to the operating room where she was placed into the supine position.  General LMA anesthesia was induced without incident. She was placed in the dorsal lithotomy position using candy cane stirrups, and was examined; A Betadine prep and drape was performed in sterile fashion.   A  Red Robinson catheter was used to drain the bladder of urine. A weighted speculum was placed into  the vagina and a single tooth tenaculum was applied to the anterior lip of the cervix. The cervix was gently dilated using Hank's Dilators to accommodate a 7 mm suction curette, that was gently advanced to the uterine fundus.  The suction device was then activated and the curette was slowly rotated to clear the uterine cavity of products of conception.  A sharp curettage with a serrated curette  was then performed to confirm complete emptying of the uterus. Minimal bleeding was encountered. The tenaculum was removed along with all instruments  from the  vagina.   The patient was awakened, mobilized and taken to the recovery room in satisfactory condition.  The procedure was  well-tolerated.  The patient will be discharged to home as per PACU criteria.  Routine postoperative instructions were given along with a prescription for analgesics.  She will follow up in the clinic in 1-2 weeks for postoperative evaluation.  Rubie Maid, MD [facility]

## 2019-06-01 NOTE — Transfer of Care (Signed)
Immediate Anesthesia Transfer of Care Note  Patient: Tracy [name]  Procedure(s) Performed: DILATATION AND EVACUATION (N/A )  Patient Location: PACU  Anesthesia Type:General  Level of Consciousness: awake, alert  and oriented  Airway & Oxygen Therapy: Patient Spontanous Breathing and Patient connected to face mask oxygen  Post-op Assessment: Report given to RN and Post -op Vital signs reviewed and stable  Post vital signs: Reviewed and stable  Last Vitals:  Vitals Value Taken Time  BP    Temp    Pulse    Resp    SpO2      Last Pain:  Vitals:   06/01/19 1801  TempSrc:   PainSc: 3          Complications: No apparent anesthesia complications

## 2019-06-02 ENCOUNTER — Encounter: Payer: Self-pay | Admitting: Obstetrics and Gynecology

## 2019-06-03 LAB — SURGICAL PATHOLOGY

## 2019-06-04 LAB — GC/CHLAMYDIA PROBE AMP
Chlamydia trachomatis, NAA: NEGATIVE
Neisseria Gonorrhoeae by PCR: NEGATIVE

## 2019-06-05 ENCOUNTER — Encounter: Payer: Self-pay | Admitting: Obstetrics and Gynecology

## 2019-06-06 NOTE — Anesthesia Postprocedure Evaluation (Signed)
Anesthesia Post Note  Patient: Tracy [name]  Procedure(s) Performed: DILATATION AND EVACUATION (N/A )  Patient location during evaluation: PACU Anesthesia Type: General Level of consciousness: awake and alert and oriented Pain management: pain level controlled Vital Signs Assessment: post-procedure vital signs reviewed and stable Respiratory status: spontaneous breathing Cardiovascular status: blood pressure returned to baseline Anesthetic complications: no     Last Vitals:  Vitals:   06/01/19 1640 06/01/19 1642  BP: 132/84   Pulse:  93  Resp:    Temp:    SpO2:  98%    Last Pain:  Vitals:   06/01/19 1801  TempSrc:   PainSc: 3                  Kylei Purington

## 2019-10-10 NOTE — L&D Delivery Note (Signed)
       Delivery Note   DAVITA SUBLETT is a 23 y.o. [id number] at [redacted]w[redacted]d Estimated Date of Delivery: 08/29/20  PRE-OPERATIVE DIAGNOSIS:  1) [redacted]w[redacted]d pregnancy.   POST-OPERATIVE DIAGNOSIS:  1) [redacted]w[redacted]d pregnancy s/p Vaginal, Spontaneous   Delivery Type: Vaginal, Spontaneous    Delivery Anesthesia: None   Labor Complications:  none    ESTIMATED BLOOD LOSS: 960 ml    FINDINGS:   1) female infant, Apgar scores of 8   at 1 minute and 9   at 5 minutes and a birthweight of   ounces.    2) Nuchal cord: none  SPECIMENS:   PLACENTA:   Appearance: Intact , 3 vessel cord, cord blood collected   Removal: Spontaneous      Disposition:  per protocol  DISPOSITION:  Infant to left in stable condition in the delivery room, with L&D personnel and mother,  NARRATIVE SUMMARY: Labor course:  Ms. EMONIE ESPERICUETA is a [id number] at [redacted]w[redacted]d who presented for induction of labor.  She progressed well in labor with and without pitocin.  She received no anesthesia and proceeded to complete dilation. She evidenced good maternal expulsive effort during the second stage. She went on to deliver a viable female infant " Lilly". The placenta delivered without problems and was noted to be complete. IV came out with pushing, IM pitocin administered. Pt noted to have heavy bleeding, 800 mcg cytotec placed rectally. Pt not tolerating fundal message well. IV restarted , pitocin infusing. Bleeding slowed. Will continue to monitor. A perineal and vaginal examination was performed. Episiotomy/Lacerations:bilateral Labial  abrasion, hemostatic not in need of repair. The patient tolerated this poorly, IV fentanyl ordered for pain. Doreene Burke, CNM  09/03/2020 10:13 AM

## 2020-03-09 ENCOUNTER — Ambulatory Visit (INDEPENDENT_AMBULATORY_CARE_PROVIDER_SITE_OTHER): Payer: Self-pay | Admitting: Certified Nurse Midwife

## 2020-03-09 ENCOUNTER — Other Ambulatory Visit: Payer: Self-pay

## 2020-03-09 ENCOUNTER — Other Ambulatory Visit (HOSPITAL_COMMUNITY)
Admission: RE | Admit: 2020-03-09 | Discharge: 2020-03-09 | Disposition: A | Discharge status: Home / Self Care | Payer: Medicaid Other | Source: Ambulatory Visit | Attending: Certified Nurse Midwife | Admitting: Certified Nurse Midwife

## 2020-03-09 ENCOUNTER — Encounter: Payer: Self-pay | Admitting: Certified Nurse Midwife

## 2020-03-09 VITALS — BP 108/64 | HR 104 | Wt 138.4 lb

## 2020-03-09 DIAGNOSIS — Z3A16 16 weeks gestation of pregnancy: Secondary | ICD-10-CM

## 2020-03-09 DIAGNOSIS — Z124 Encounter for screening for malignant neoplasm of cervix: Secondary | ICD-10-CM

## 2020-03-09 DIAGNOSIS — Z3482 Encounter for supervision of other normal pregnancy, second trimester: Secondary | ICD-10-CM | POA: Diagnosis not present

## 2020-03-09 NOTE — Patient Instructions (Signed)

## 2020-03-09 NOTE — Progress Notes (Signed)
NEW OB HISTORY AND PHYSICAL  SUBJECTIVE:       Tracy [name] is a 24 y.o. [phone] female, No LMP recorded. (Menstrual status: Oral contraceptives)., Estimated Date of Delivery: None noted., Unknown, presents today for establishment of Prenatal Care. She complains of headaches.    Social Relationship: engaged Living: with fiance  Work: none Exercise: none Smoke: denies  Drugs/alcohol:denies    Gynecologic History No LMP recorded. (Menstrual status: Oral contraceptives). Normal Contraception: none Last Pap: 12/2016. Results were: abnormal, LSIL  Obstetric History OB History  Gravida Para Term Preterm AB Living  3 1   1 2 1   SAB TAB Ectopic Multiple Live Births    2   0 1    # Outcome Date GA Lbr Len/2nd Weight Sex Delivery Anes PTL Lv  3 TAB 2020          2 Preterm 03/16/17 [redacted]w[redacted]d / 00:21 3 lb 7 oz (1.56 kg) F Vag-Spont None  LIV  1 TAB 2016        ND    Past Medical History:  Diagnosis Date  . Abortion history   . COVID-19 virus infection 06/01/2019  . Headache    chronic and migraines  . Heart murmur   . Irregular periods    history    Past Surgical History:  Procedure Laterality Date  . adnoids    . DILATION AND EVACUATION N/A 06/01/2019   Procedure: DILATATION AND EVACUATION;  Surgeon: Rubie Maid, MD;  Location: [facility] ORS;  Service: Gynecology;  Laterality: N/A;  . WISDOM TOOTH EXTRACTION      Current Outpatient Medications on File Prior to Visit  Medication Sig Dispense Refill  . ibuprofen (ADVIL) 800 MG tablet Take 1 tablet (800 mg total) by mouth every 8 (eight) hours as needed. 30 tablet 1  . norethindrone (MICRONOR,CAMILA,ERRIN) 0.35 MG tablet Take 1 tablet (0.35 mg total) by mouth daily. 1 Package 11  . promethazine (PHENERGAN) 25 MG tablet Take 25 mg by mouth every 4 (four) hours as needed for nausea.     No current facility-administered medications on file prior to visit.    No Known Allergies  Social History   Socioeconomic History  .  Marital status: Single    Spouse name: Not on file  . Number of children: Not on file  . Years of education: Not on file  . Highest education level: Not on file  Occupational History  . Not on file  Tobacco Use  . Smoking status: Former Research scientist (life sciences)  . Smokeless tobacco: Never Used  Substance and Sexual Activity  . Alcohol use: No  . Drug use: No  . Sexual activity: Yes    Birth control/protection: None  Other Topics Concern  . Not on file  Social History Narrative  . Not on file   Social Determinants of Health   Financial Resource Strain:   . Difficulty of Paying Living Expenses:   Food Insecurity:   . Worried About Charity fundraiser in the Last Year:   . Arboriculturist in the Last Year:   Transportation Needs:   . Film/video editor (Medical):   Marland Kitchen Lack of Transportation (Non-Medical):   Physical Activity:   . Days of Exercise per Week:   . Minutes of Exercise per Session:   Stress:   . Feeling of Stress :   Social Connections:   . Frequency of Communication with Friends and Family:   . Frequency of Social Gatherings with Friends and  Family:   . Attends Religious Services:   . Active Member of Clubs or Organizations:   . Attends Banker Meetings:   Marland Kitchen Marital Status:   Intimate Partner Violence:   . Fear of Current or Ex-Partner:   . Emotionally Abused:   Marland Kitchen Physically Abused:   . Sexually Abused:     Family History  Problem Relation Age of Onset  . Hypertension Maternal Grandmother     The following portions of the patient's history were reviewed and updated as appropriate: allergies, current medications, past OB history, past medical history, past surgical history, past family history, past social history, and problem list.    OBJECTIVE: Initial Physical Exam (New OB)  GENERAL APPEARANCE: alert, well appearing, in no apparent distress, oriented to person, place and time HEAD: normocephalic, atraumatic MOUTH: mucous membranes moist, pharynx  normal without lesions THYROID: no thyromegaly or masses present BREASTS: no masses noted, no significant tenderness, no palpable axillary nodes, no skin changes LUNGS: clear to auscultation, no wheezes, rales or rhonchi, symmetric air entry HEART: regular rate and rhythm, no murmurs ABDOMEN: soft, nontender, nondistended, no abnormal masses, no epigastric pain, fundus soft, nontender 16 weeks size and FHT present EXTREMITIES: no redness or tenderness in the calves or thighs, no edema, no limitation in range of motion, intact peripheral pulses SKIN: normal coloration and turgor, no rashes LYMPH NODES: no adenopathy palpable NEUROLOGIC: alert, oriented, normal speech, no focal findings or movement disorder noted  PELVIC EXAM EXTERNAL GENITALIA: normal appearing vulva with no masses, tenderness or lesions VAGINA: no abnormal discharge or lesions CERVIX: no lesions or cervical motion tenderness UTERUS: gravid ADNEXA: no masses palpable and nontender OB EXAM PELVIMETRY: appears adequate RECTUM: exam not indicated  ASSESSMENT: Normal pregnancy  PLAN: New OB counseling: The patient has been given an overview regarding routine prenatal care. Recommendations regarding diet, weight gain, and exercise in pregnancy were given. Prenatal testing, optional genetic testing,carrier screening test, and ultrasound use in pregnancy were reviewed.  Panorama testing today. Hx PPROM, information 17 P given. Pt will let us know if interested ASAP. Benefits of Breast Feeding were discussed. The patient is encouraged to consider nursing her baby post partum.  Doreene Burke, CNM

## 2020-03-10 ENCOUNTER — Telehealth: Payer: Self-pay

## 2020-03-10 ENCOUNTER — Telehealth: Payer: Self-pay | Admitting: Certified Nurse Midwife

## 2020-03-10 LAB — URINALYSIS, ROUTINE W REFLEX MICROSCOPIC
Bilirubin, UA: NEGATIVE
Glucose, UA: NEGATIVE
Leukocytes,UA: NEGATIVE
Nitrite, UA: NEGATIVE
Specific Gravity, UA: 1.023 (ref 1.005–1.030)
Urobilinogen, Ur: 0.2 mg/dL (ref 0.2–1.0)
pH, UA: 7 (ref 5.0–7.5)

## 2020-03-10 LAB — CBC WITH DIFFERENTIAL
Basophils Absolute: 0 10*3/uL (ref 0.0–0.2)
Basos: 0 %
EOS (ABSOLUTE): 0 10*3/uL (ref 0.0–0.4)
Eos: 0 %
Hematocrit: 33.7 % — ABNORMAL LOW (ref 34.0–46.6)
Hemoglobin: 11.3 g/dL (ref 11.1–15.9)
Immature Grans (Abs): 0 10*3/uL (ref 0.0–0.1)
Immature Granulocytes: 0 %
Lymphocytes Absolute: 0.7 10*3/uL (ref 0.7–3.1)
Lymphs: 8 %
MCH: 29.9 pg (ref 26.6–33.0)
MCHC: 33.5 g/dL (ref 31.5–35.7)
MCV: 89 fL (ref 79–97)
Monocytes Absolute: 1.4 10*3/uL — ABNORMAL HIGH (ref 0.1–0.9)
Monocytes: 15 %
Neutrophils Absolute: 7 10*3/uL (ref 1.4–7.0)
Neutrophils: 77 %
RBC: 3.78 x10E6/uL (ref 3.77–5.28)
RDW: 12.8 % (ref 11.7–15.4)
WBC: 9.1 10*3/uL (ref 3.4–10.8)

## 2020-03-10 LAB — RPR: RPR Ser Ql: NONREACTIVE

## 2020-03-10 LAB — MICROSCOPIC EXAMINATION
Casts: NONE SEEN /lpf
Epithelial Cells (non renal): >10 /hpf — AB (ref 0–10)

## 2020-03-10 LAB — ABO AND RH: Rh Factor: POSITIVE

## 2020-03-10 LAB — VARICELLA ZOSTER ANTIBODY, IGG: Varicella zoster IgG: <135 index — ABNORMAL LOW (ref >165)

## 2020-03-10 LAB — HEPATITIS B SURFACE ANTIGEN: Hepatitis B Surface Ag: NEGATIVE

## 2020-03-10 NOTE — Telephone Encounter (Signed)
Voicemail message left for patient- she needs to sign the form for the Natera sample to be sent out.

## 2020-03-10 NOTE — Telephone Encounter (Signed)
Patient walked into clinic to sign paperwork for her Natera sample to be sent out. Informed patient that the nurse had went to lunch and that she would be back within the next 30 minutes. Patient became irritable and asked "How long is this going to take" I informed her that it would be a wait until the nurse got back from her lunch and that they were just needing a signature. Patient asked if she could just write her signature on a sheet of paper and copy it onto the form- informed patient that we wouldn't be able to do it that way and apologized. Patient walked out of the clinic.

## 2020-03-10 NOTE — Telephone Encounter (Signed)
Patient came in the office to sign the requisition for N[employer].

## 2020-03-11 LAB — URINE CULTURE

## 2020-03-12 LAB — CYTOLOGY - PAP
Chlamydia: NEGATIVE
Comment: NEGATIVE
Comment: NORMAL
Diagnosis: NEGATIVE
Neisseria Gonorrhea: NEGATIVE

## 2020-03-17 ENCOUNTER — Other Ambulatory Visit: Payer: Self-pay | Admitting: Certified Nurse Midwife

## 2020-03-17 MED ORDER — AMPICILLIN 500 MG PO CAPS
500.0000 mg | ORAL_CAPSULE | Freq: Four times a day (QID) | ORAL | 0 refills | Status: AC
Start: 2020-03-17 — End: 2020-03-24

## 2020-03-17 NOTE — Progress Notes (Signed)
GBS positive in urine at [facility] orders placed for treatment.   Doreene Burke, CNM

## 2020-04-08 ENCOUNTER — Other Ambulatory Visit: Payer: Self-pay | Admitting: Certified Nurse Midwife

## 2020-04-08 ENCOUNTER — Ambulatory Visit (INDEPENDENT_AMBULATORY_CARE_PROVIDER_SITE_OTHER): Payer: Medicaid Other

## 2020-04-08 ENCOUNTER — Ambulatory Visit (INDEPENDENT_AMBULATORY_CARE_PROVIDER_SITE_OTHER): Payer: Medicaid Other | Admitting: Certified Nurse Midwife

## 2020-04-08 ENCOUNTER — Other Ambulatory Visit: Payer: Self-pay

## 2020-04-08 VITALS — BP 94/59 | HR 88 | Wt 138.4 lb

## 2020-04-08 DIAGNOSIS — Z3A19 19 weeks gestation of pregnancy: Secondary | ICD-10-CM

## 2020-04-08 DIAGNOSIS — Z789 Other specified health status: Secondary | ICD-10-CM

## 2020-04-08 DIAGNOSIS — R8271 Bacteriuria: Secondary | ICD-10-CM

## 2020-04-08 DIAGNOSIS — Z419 Encounter for procedure for purposes other than remedying health state, unspecified: Secondary | ICD-10-CM | POA: Diagnosis not present

## 2020-04-08 DIAGNOSIS — Z3689 Encounter for other specified antenatal screening: Secondary | ICD-10-CM

## 2020-04-08 DIAGNOSIS — Z3492 Encounter for supervision of normal pregnancy, unspecified, second trimester: Secondary | ICD-10-CM

## 2020-04-08 DIAGNOSIS — Z8759 Personal history of other complications of pregnancy, childbirth and the puerperium: Secondary | ICD-10-CM

## 2020-04-08 LAB — POCT URINALYSIS DIPSTICK OB
Bilirubin, UA: NEGATIVE
Blood, UA: NEGATIVE
Glucose, UA: NEGATIVE
Ketones, UA: NEGATIVE
Leukocytes, UA: NEGATIVE
Nitrite, UA: NEGATIVE
POC,PROTEIN,UA: NEGATIVE
Spec Grav, UA: 1.02 (ref 1.010–1.025)
Urobilinogen, UA: 0.2 E.U./dL
pH, UA: 5 (ref 5.0–8.0)

## 2020-04-08 NOTE — Progress Notes (Signed)
ROB-Doing well, no questions or concerns. Anatomy scan today incomplete, but normal. Declines 17-P. Agrees to vitamin and herbal regimen, sent via MyChart. Anticipatory guidance regarding course of prenatal care. Reviewed red flag symptoms and when to call. RTC x 2 weeks for follow up anatomy scan; RTC x 4 weeks for ROB or sooner if needed.   ULTRASOUND REPORT  Location: [facility] Date of Service: 04/08/2020   Indications:Anatomy Ultrasound   Findings:  Singleton intrauterine pregnancy is visualized with FHR at 151 BPM. Biometrics give an (U/S) Gestational age of [redacted]w[redacted]d and an (U/S) EDD of 08/29/2020; this correlates with the clinically established Estimated Date of Delivery: None noted.  Fetal presentation is transverse.  EFW: 313 g ( 11 oz ). Fetal Percentile  Placenta: anterior. Grade: 1 AFI: subjectively normal.  Anatomic survey is incomplete for LVOT ,RVOT , Profile and normal; Gender - surprise.    Right Ovary is normal in appearance. Left Ovary is normal appearance. Survey of the adnexa demonstrates no adnexal masses. There is no free peritoneal fluid in the cul de sac.  Impression: 1. 19 w 3 d Viable Singleton Intrauterine pregnancy by U/S. 2. (U/S) EDD is 08/29/2020. 3. Incomplete for LVOT, RVOT, Proflie  Recommendations: 1.Clinical correlation with the patient's History and Physical Exam.

## 2020-04-08 NOTE — Patient Instructions (Signed)
Back Pain in Pregnancy Back pain during pregnancy is common. Back pain may be caused by several factors that are related to changes during your pregnancy. Follow these instructions at home: Managing pain, stiffness, and swelling      If directed, for sudden (acute) back pain, put ice on the painful area. ? Put ice in a plastic bag. ? Place a towel between your skin and the bag. ? Leave the ice on for 20 minutes, 2-3 times per day.  If directed, apply heat to the affected area before you exercise. Use the heat source that your health care provider recommends, such as a moist heat pack or a heating pad. ? Place a towel between your skin and the heat source. ? Leave the heat on for 20-30 minutes. ? Remove the heat if your skin turns bright red. This is especially important if you are unable to feel pain, heat, or cold. You may have a greater risk of getting burned.  If directed, massage the affected area. Activity  Exercise as told by your health care provider. Gentle exercise is the best way to prevent or manage back pain.  Listen to your body when lifting. If lifting hurts, ask for help or bend your knees. This uses your leg muscles instead of your back muscles.  Squat down when picking up something from the floor. Do not bend over.  Only use bed rest for short periods as told by your health care provider. Bed rest should only be used for the most severe episodes of back pain. Standing, sitting, and lying down  Do not stand in one place for long periods of time.  Use good posture when sitting. Make sure your head rests over your shoulders and is not hanging forward. Use a pillow on your lower back if necessary.  Try sleeping on your side, preferably the left side, with a pregnancy support pillow or 1-2 regular pillows between your legs. ? If you have back pain after a night's rest, your bed may be too soft. ? A firm mattress may provide more support for your back during  pregnancy. General instructions  Do not wear high heels.  Eat a healthy diet. Try to gain weight within your health care provider's recommendations.  Use a maternity girdle, elastic sling, or back brace as told by your health care provider.  Take over-the-counter and prescription medicines only as told by your health care provider.  Work with a physical therapist or massage therapist to find ways to manage back pain. Acupuncture or massage therapy may be helpful.  Keep all follow-up visits as told by your health care provider. This is important. Contact a health care provider if:  Your back pain interferes with your daily activities.  You have increasing pain in other parts of your body. Get help right away if:  You develop numbness, tingling, weakness, or problems with the use of your arms or legs.  You develop severe back pain that is not controlled with medicine.  You have a change in bowel or bladder control.  You develop shortness of breath, dizziness, or you faint.  You develop nausea, vomiting, or sweating.  You have back pain that is a rhythmic, cramping pain similar to labor pains. Labor pain is usually 1-2 minutes apart, lasts for about 1 minute, and involves a bearing down feeling or pressure in your pelvis.  You have back pain and your water breaks or you have vaginal bleeding.  You have back pain or numbness  that travels down your leg.  Your back pain developed after you fell.  You develop pain on one side of your back.  You see blood in your urine.  You develop skin blisters in the area of your back pain. Summary  Back pain may be caused by several factors that are related to changes during your pregnancy.  Follow instructions as told by your health care provider for managing pain, stiffness, and swelling.  Exercise as told by your health care provider. Gentle exercise is the best way to prevent or manage back pain.  Take over-the-counter and  prescription medicines only as told by your health care provider.  Keep all follow-up visits as told by your health care provider. This is important. This information is not intended to replace advice given to you by your health care provider. Make sure you discuss any questions you have with your health care provider. Document Revised: 01/14/2019 Document Reviewed: 03/13/2018 Elsevier Patient Education  [place].   Round Ligament Pain  The round ligament is a cord of muscle and tissue that helps support the uterus. It can become a source of pain during pregnancy if it becomes stretched or twisted as the baby grows. The pain usually begins in the second trimester (13-28 weeks) of pregnancy, and it can come and go until the baby is delivered. It is not a serious problem, and it does not cause harm to the baby. Round ligament pain is usually a short, sharp, and pinching pain, but it can also be a dull, lingering, and aching pain. The pain is felt in the lower side of the abdomen or in the groin. It usually starts deep in the groin and moves up to the outside of the hip area. The pain may occur when you:  Suddenly change position, such as quickly going from a sitting to standing position.  Roll over in bed.  Cough or sneeze.  Do physical activity. Follow these instructions at home:   Watch your condition for any changes.  When the pain starts, relax. Then try any of these methods to help with the pain: ? Sitting down. ? Flexing your knees up to your abdomen. ? Lying on your side with one pillow under your abdomen and another pillow between your legs. ? Sitting in a warm bath for 15-20 minutes or until the pain goes away.  Take over-the-counter and prescription medicines only as told by your health care provider.  Move slowly when you sit down or stand up.  Avoid long walks if they cause pain.  Stop or reduce your physical activities if they cause pain.  Keep all  follow-up visits as told by your health care provider. This is important. Contact a health care provider if:  Your pain does not go away with treatment.  You feel pain in your back that you did not have before.  Your medicine is not helping. Get help right away if:  You have a fever or chills.  You develop uterine contractions.  You have vaginal bleeding.  You have nausea or vomiting.  You have diarrhea.  You have pain when you urinate. Summary  Round ligament pain is felt in the lower abdomen or groin. It is usually a short, sharp, and pinching pain. It can also be a dull, lingering, and aching pain.  This pain usually begins in the second trimester (13-28 weeks). It occurs because the uterus is stretching with the growing baby, and it is not harmful to the  baby.  Bonita Quin may notice the pain when you suddenly change position, when you cough or sneeze, or during physical activity.  Relaxing, flexing your knees to your abdomen, lying on one side, or taking a warm bath may help to get rid of the pain.  Get help from your health care provider if the pain does not go away or if you have vaginal bleeding, nausea, vomiting, diarrhea, or painful urination. This information is not intended to replace advice given to you by your health care provider. Make sure you discuss any questions you have with your health care provider. Document Revised: 03/13/2018 Document Reviewed: 03/13/2018 Elsevier Patient Education  2020 [employer].   Second Trimester of Pregnancy  The second trimester is from week 14 through week 27 (month 4 through 6). This is often the time in pregnancy that you feel your best. Often times, morning sickness has lessened or quit. You may have more energy, and you may get hungry more often. Your unborn baby is growing rapidly. At the end of the sixth month, he or she is about 9 inches long and weighs about 1 pounds. You will likely feel the baby move between 18 and 20 weeks  of pregnancy. Follow these instructions at home: Medicines  Take over-the-counter and prescription medicines only as told by your doctor. Some medicines are safe and some medicines are not safe during pregnancy.  Take a prenatal vitamin that contains at least 600 micrograms (mcg) of folic acid.  If you have trouble pooping (constipation), take medicine that will make your stool soft (stool softener) if your doctor approves. Eating and drinking   Eat regular, healthy meals.  Avoid raw meat and uncooked cheese.  If you get low calcium from the food you eat, talk to your doctor about taking a daily calcium supplement.  Avoid foods that are high in fat and sugars, such as fried and sweet foods.  If you feel sick to your stomach (nauseous) or throw up (vomit): ? Eat 4 or 5 small meals a day instead of 3 large meals. ? Try eating a few soda crackers. ? Drink liquids between meals instead of during meals.  To prevent constipation: ? Eat foods that are high in fiber, like fresh fruits and vegetables, whole grains, and beans. ? Drink enough fluids to keep your pee (urine) clear or pale yellow. Activity  Exercise only as told by your doctor. Stop exercising if you start to have cramps.  Do not exercise if it is too hot, too humid, or if you are in a place of great height (high altitude).  Avoid heavy lifting.  Wear low-heeled shoes. Sit and stand up straight.  You can continue to have sex unless your doctor tells you not to. Relieving pain and discomfort  Wear a good support bra if your breasts are tender.  Take warm water baths (sitz baths) to soothe pain or discomfort caused by hemorrhoids. Use hemorrhoid cream if your doctor approves.  Rest with your legs raised if you have leg cramps or low back pain.  If you develop puffy, bulging veins (varicose veins) in your legs: ? Wear support hose or compression stockings as told by your doctor. ? Raise (elevate) your feet for 15  minutes, 3-4 times a day. ? Limit salt in your food. Prenatal care  Write down your questions. Take them to your prenatal visits.  Keep all your prenatal visits as told by your doctor. This is important. Safety  Wear your seat belt  when driving.  Make a list of emergency phone numbers, including numbers for family, friends, the hospital, and police and fire departments. General instructions  Ask your doctor about the right foods to eat or for help finding a counselor, if you need these services.  Ask your doctor about local prenatal classes. Begin classes before month 6 of your pregnancy.  Do not use hot tubs, steam rooms, or saunas.  Do not douche or use tampons or scented sanitary pads.  Do not cross your legs for long periods of time.  Visit your dentist if you have not done so. Use a soft toothbrush to brush your teeth. Floss gently.  Avoid all smoking, herbs, and alcohol. Avoid drugs that are not approved by your doctor.  Do not use any products that contain nicotine or tobacco, such as cigarettes and e-cigarettes. If you need help quitting, ask your doctor.  Avoid cat litter boxes and soil used by cats. These carry germs that can cause birth defects in the baby and can cause a loss of your baby (miscarriage) or stillbirth. Contact a doctor if:  You have mild cramps or pressure in your lower belly.  You have pain when you pee (urinate).  You have bad smelling fluid coming from your vagina.  You continue to feel sick to your stomach (nauseous), throw up (vomit), or have watery poop (diarrhea).  You have a nagging pain in your belly area.  You feel dizzy. Get help right away if:  You have a fever.  You are leaking fluid from your vagina.  You have spotting or bleeding from your vagina.  You have severe belly cramping or pain.  You lose or gain weight rapidly.  You have trouble catching your breath and have chest pain.  You notice sudden or extreme  puffiness (swelling) of your face, hands, ankles, feet, or legs.  You have not felt the baby move in over an hour.  You have severe headaches that do not go away when you take medicine.  You have trouble seeing. Summary  The second trimester is from week 14 through week 27 (months 4 through 6). This is often the time in pregnancy that you feel your best.  To take care of yourself and your unborn baby, you will need to eat healthy meals, take medicines only if your doctor tells you to do so, and do activities that are safe for you and your baby.  Call your doctor if you get sick or if you notice anything unusual about your pregnancy. Also, call your doctor if you need help with the right food to eat, or if you want to know what activities are safe for you. This information is not intended to replace advice given to you by your health care provider. Make sure you discuss any questions you have with your health care provider. Document Revised: 01/17/2019 Document Reviewed: 10/31/2016 Elsevier Patient Education  2020 [employer].

## 2020-04-10 DIAGNOSIS — R8271 Bacteriuria: Secondary | ICD-10-CM | POA: Insufficient documentation

## 2020-04-10 DIAGNOSIS — Z8759 Personal history of other complications of pregnancy, childbirth and the puerperium: Secondary | ICD-10-CM | POA: Insufficient documentation

## 2020-04-20 ENCOUNTER — Other Ambulatory Visit: Payer: Medicaid Other

## 2020-04-30 ENCOUNTER — Telehealth: Payer: Self-pay | Admitting: Certified Nurse Midwife

## 2020-04-30 ENCOUNTER — Telehealth: Payer: Self-pay

## 2020-04-30 NOTE — Telephone Encounter (Signed)
Pt called in and stated that last month she got a message on her my chart from [place]. The pt stated that she tested positive for a Beta Strep. The pt is requesting a call back. She stated that she never picked up the meds that was sent in. Please advise

## 2020-04-30 NOTE — Telephone Encounter (Signed)
mychart message sent to patient

## 2020-05-05 ENCOUNTER — Ambulatory Visit (INDEPENDENT_AMBULATORY_CARE_PROVIDER_SITE_OTHER): Payer: Medicaid Other

## 2020-05-05 ENCOUNTER — Ambulatory Visit (INDEPENDENT_AMBULATORY_CARE_PROVIDER_SITE_OTHER): Payer: Medicaid Other | Admitting: Certified Nurse Midwife

## 2020-05-05 ENCOUNTER — Encounter: Payer: Self-pay | Admitting: Certified Nurse Midwife

## 2020-05-05 VITALS — BP 113/61 | HR 87 | Wt 140.7 lb

## 2020-05-05 DIAGNOSIS — Z3A19 19 weeks gestation of pregnancy: Secondary | ICD-10-CM

## 2020-05-05 DIAGNOSIS — Z3A23 23 weeks gestation of pregnancy: Secondary | ICD-10-CM

## 2020-05-05 DIAGNOSIS — Z3492 Encounter for supervision of normal pregnancy, unspecified, second trimester: Secondary | ICD-10-CM

## 2020-05-05 DIAGNOSIS — Z3689 Encounter for other specified antenatal screening: Secondary | ICD-10-CM

## 2020-05-05 LAB — POCT URINALYSIS DIPSTICK OB
Bilirubin, UA: NEGATIVE
Blood, UA: NEGATIVE
Glucose, UA: NEGATIVE
Ketones, UA: NEGATIVE
Leukocytes, UA: NEGATIVE
Nitrite, UA: NEGATIVE
Spec Grav, UA: 1.015 (ref 1.010–1.025)
Urobilinogen, UA: 0.2 E.U./dL
pH, UA: 7 (ref 5.0–8.0)

## 2020-05-05 NOTE — Progress Notes (Signed)
ROB doing well. Feels good movement. Anticipatory guideance on 28 wk labs. PT state she did not see my chart message about urine and did not take antibiotics. She denies symptoms of UTI. Urine culture ordered. Explained GBS in urine . She verbaizes and agrees. Rose Child psychotherapist into see pt. Follow up 4 wks with Marcelino Duster.   Doreene Burke, CNM

## 2020-05-05 NOTE — Progress Notes (Signed)
Patient comes in today for ROB visit. Patient states that she is having some dizzy spells. Patient states that she has these spells about every other day.

## 2020-05-05 NOTE — Patient Instructions (Signed)

## 2020-05-07 LAB — URINE CULTURE

## 2020-05-09 DIAGNOSIS — Z419 Encounter for procedure for purposes other than remedying health state, unspecified: Secondary | ICD-10-CM | POA: Diagnosis not present

## 2020-05-31 ENCOUNTER — Other Ambulatory Visit: Payer: Medicaid Other

## 2020-05-31 ENCOUNTER — Encounter: Payer: Self-pay | Admitting: Certified Nurse Midwife

## 2020-05-31 ENCOUNTER — Encounter: Payer: Medicaid Other | Admitting: Certified Nurse Midwife

## 2020-05-31 DIAGNOSIS — Z131 Encounter for screening for diabetes mellitus: Secondary | ICD-10-CM

## 2020-05-31 DIAGNOSIS — Z3482 Encounter for supervision of other normal pregnancy, second trimester: Secondary | ICD-10-CM

## 2020-05-31 DIAGNOSIS — Z6740 Type O blood, Rh positive: Secondary | ICD-10-CM | POA: Insufficient documentation

## 2020-05-31 DIAGNOSIS — Z130 Encounter for screening for diseases of the blood and blood-forming organs and certain disorders involving the immune mechanism: Secondary | ICD-10-CM

## 2020-05-31 DIAGNOSIS — Z3A27 27 weeks gestation of pregnancy: Secondary | ICD-10-CM

## 2020-06-09 DIAGNOSIS — Z419 Encounter for procedure for purposes other than remedying health state, unspecified: Secondary | ICD-10-CM | POA: Diagnosis not present

## 2020-07-09 DIAGNOSIS — Z419 Encounter for procedure for purposes other than remedying health state, unspecified: Secondary | ICD-10-CM | POA: Diagnosis not present

## 2020-07-20 ENCOUNTER — Encounter: Payer: Self-pay | Admitting: Obstetrics and Gynecology

## 2020-07-20 ENCOUNTER — Observation Stay
Admission: EM | Admit: 2020-07-20 | Discharge: 2020-07-21 | Disposition: A | Discharge status: Home / Self Care | Payer: Medicaid Other | Attending: Certified Nurse Midwife | Admitting: Certified Nurse Midwife

## 2020-07-20 ENCOUNTER — Other Ambulatory Visit: Payer: Self-pay

## 2020-07-20 DIAGNOSIS — R350 Frequency of micturition: Secondary | ICD-10-CM | POA: Diagnosis not present

## 2020-07-20 DIAGNOSIS — Z3A34 34 weeks gestation of pregnancy: Secondary | ICD-10-CM | POA: Insufficient documentation

## 2020-07-20 LAB — URINALYSIS, ROUTINE W REFLEX MICROSCOPIC
Bacteria, UA: NONE SEEN
Bilirubin Urine: NEGATIVE
Glucose, UA: 50 mg/dL — AB
Ketones, ur: 20 mg/dL — AB
Leukocytes,Ua: NEGATIVE
Nitrite: NEGATIVE
Protein, ur: 30 mg/dL — AB
RBC / HPF: >50 RBC/hpf — ABNORMAL HIGH (ref 0–5)
Specific Gravity, Urine: 1.023 (ref 1.005–1.030)
pH: 6 (ref 5.0–8.0)

## 2020-07-20 MED ORDER — ACETAMINOPHEN 500 MG PO TABS
1000.0000 mg | ORAL_TABLET | Freq: Once | ORAL | Status: AC | PRN
  Administered 2020-07-20: 1000 mg via ORAL
  Filled 2020-07-20: qty 2

## 2020-07-20 NOTE — OB Triage Note (Signed)
Patient arrived in triage with c/o contractions that started this morning, increasing in pain and frequency. Currently having contractions every 4-5 mins that she rates a 3-4/10. States pain is "sharp" and "crampy". Reports good fetal movement and denies vaginal bleeding. Patient noticed small amount of leaking fluid en route to hospital. Patient also reports need to void "constantly" and feeling that she is unable to empty her bladder completely. Monitors applied and assessing. Abdomen palpates soft, somewhat tender to touch on bilateral sides.

## 2020-07-21 ENCOUNTER — Telehealth: Payer: Self-pay

## 2020-07-21 DIAGNOSIS — Z3A34 34 weeks gestation of pregnancy: Secondary | ICD-10-CM

## 2020-07-21 DIAGNOSIS — R350 Frequency of micturition: Secondary | ICD-10-CM | POA: Diagnosis not present

## 2020-07-21 LAB — RUPTURE OF MEMBRANE (ROM)PLUS: Rom Plus: NEGATIVE

## 2020-07-21 NOTE — Telephone Encounter (Signed)
mychart message sent to patient

## 2020-07-21 NOTE — Progress Notes (Signed)
Patient reports, "I feel a lot better."

## 2020-07-21 NOTE — OB Triage Note (Signed)
    L&D OB Triage Note  SUBJECTIVE AVANTHIKA DEHNERT is a 24 y.o. [phone] female at [redacted]w[redacted]d, EDD Estimated Date of Delivery: 08/29/20 who presented to triage with complaints of leaking of fluid, .contractions-sharp and cramping in lower abdomen, urinary frequency. Feels good movement , no vaginal bleeding.  OB History  Gravida Para Term Preterm AB Living  4 1 0 1 2 1   SAB TAB Ectopic Multiple Live Births  0 2 0 0 1    # Outcome Date GA Lbr Len/2nd Weight Sex Delivery Anes PTL Lv  4 Current           3 TAB 2020          2 Preterm 03/16/17 [redacted]w[redacted]d / 00:21 1560 g F Vag-Spont None  LIV     Name: Plamondon,GIRL Janeece     Apgar1: 8  Apgar5: 8  1 TAB 2016        ND    Medications Prior to Admission  Medication Sig Dispense Refill Last Dose  . Prenatal Vit-Fe Fumarate-FA (PRENATAL MULTIVITAMIN) TABS tablet Take 1 tablet by mouth daily at 12 noon.        OBJECTIVE  Nursing Evaluation:   BP 117/65 (BP Location: Left Arm)   Pulse 95   Temp 98.4 F (36.9 C) (Oral)   Resp 18   Ht 5\' 5"  (1.651 m)   Wt 62.6 kg   LMP  (LMP Unknown)   BMI 22.96 kg/m    Findings:   Urine culture pending, ROm plus negative, pain much improved with tylenol-round ligament pain.       NST was performed and has been reviewed by me.  NST INTERPRETATION: Category I  Mode: External Baseline Rate (A): 135 bpm Variability: Moderate Accelerations: 15 x 15 Decelerations: None     Contraction Frequency (min): x1  ASSESSMENT Impression:  1.  Pregnancy:  2017 at [redacted]w[redacted]d , EDD Estimated Date of Delivery: 08/29/20 2.  Reassuring fetal and maternal status 3.  Intact membranes, round ligament pain , urine culture pending  PLAN 1. Discussed current condition and above findings with patient and reassurance given.  All questions answered. 2. Discharge home with standard labor precautions given to return to L&D or call the office for problems. 3. Continue routine prenatal in office this week. Pt has not been for  prenatal visit since August. Pt encouraged to follow up this week or ASAP.   08/31/20, CNM

## 2020-07-22 ENCOUNTER — Telehealth: Payer: Self-pay

## 2020-07-22 LAB — URINE CULTURE

## 2020-07-22 NOTE — Telephone Encounter (Signed)
Attempted to contact patient- via telephone call and mychart message. Telephone number states " the person is not reachable". Mychart message remains unread. Patient has not been seen in the office since July. She needs her 28 week labs done. She has a scheduled appointment on 07/27/20.

## 2020-07-27 ENCOUNTER — Encounter: Payer: Self-pay | Admitting: Certified Nurse Midwife

## 2020-07-27 ENCOUNTER — Other Ambulatory Visit: Payer: Self-pay

## 2020-07-27 ENCOUNTER — Ambulatory Visit (INDEPENDENT_AMBULATORY_CARE_PROVIDER_SITE_OTHER): Payer: Medicaid Other | Admitting: Certified Nurse Midwife

## 2020-07-27 VITALS — BP 110/67 | HR 84 | Wt 143.4 lb

## 2020-07-27 DIAGNOSIS — Z3A35 35 weeks gestation of pregnancy: Secondary | ICD-10-CM

## 2020-07-27 LAB — POCT URINALYSIS DIPSTICK OB
Bilirubin, UA: NEGATIVE
Glucose, UA: NEGATIVE
Ketones, UA: NEGATIVE
Leukocytes, UA: NEGATIVE
Nitrite, UA: NEGATIVE
POC,PROTEIN,UA: NEGATIVE
Spec Grav, UA: 1.025 (ref 1.010–1.025)
Urobilinogen, UA: 0.2 E.U./dL
pH, UA: 5 (ref 5.0–8.0)

## 2020-07-27 NOTE — Patient Instructions (Signed)
Glucose Tolerance Test During Pregnancy Why am I having this test? The glucose tolerance test (GTT) is done to check how your body processes sugar (glucose). This is one of several tests used to diagnose diabetes that develops during pregnancy (gestational diabetes mellitus). Gestational diabetes is a temporary form of diabetes that some women develop during pregnancy. It usually occurs during the second trimester of pregnancy and goes away after delivery. Testing (screening) for gestational diabetes usually occurs between 24 and 28 weeks of pregnancy. You may have the GTT test after having a 1-hour glucose screening test if the results from that test indicate that you may have gestational diabetes. You may also have this test if:  You have a history of gestational diabetes.  You have a history of giving birth to very large babies or have experienced repeated fetal loss (stillbirth).  You have signs and symptoms of diabetes, such as: ? Changes in your vision. ? Tingling or numbness in your hands or feet. ? Changes in hunger, thirst, and urination that are not otherwise explained by your pregnancy. What is being tested? This test measures the amount of glucose in your blood at different times during a period of 3 hours. This indicates how well your body is able to process glucose. What kind of sample is taken?  Blood samples are required for this test. They are usually collected by inserting a needle into a blood vessel. How do I prepare for this test?  For 3 days before your test, eat normally. Have plenty of carbohydrate-rich foods.  Follow instructions from your health care provider about: ? Eating or drinking restrictions on the day of the test. You may be asked to not eat or drink anything other than water (fast) starting 8-10 hours before the test. ? Changing or stopping your regular medicines. Some medicines may interfere with this test. Tell a health care provider about:  All  medicines you are taking, including vitamins, herbs, eye drops, creams, and over-the-counter medicines.  Any blood disorders you have.  Any surgeries you have had.  Any medical conditions you have. What happens during the test? First, your blood glucose will be measured. This is referred to as your fasting blood glucose, since you fasted before the test. Then, you will drink a glucose solution that contains a certain amount of glucose. Your blood glucose will be measured again 1, 2, and 3 hours after drinking the solution. This test takes about 3 hours to complete. You will need to stay at the testing location during this time. During the testing period:  Do not eat or drink anything other than the glucose solution.  Do not exercise.  Do not use any products that contain nicotine or tobacco, such as cigarettes and e-cigarettes. If you need help stopping, ask your health care provider. The testing procedure may vary among health care providers and hospitals. How are the results reported? Your results will be reported as milligrams of glucose per deciliter of blood (mg/dL) or millimoles per liter (mmol/L). Your health care provider will compare your results to normal ranges that were established after testing a large group of people (reference ranges). Reference ranges may vary among labs and hospitals. For this test, common reference ranges are:  Fasting: less than 95-105 mg/dL (5.3-5.8 mmol/L).  1 hour after drinking glucose: less than 180-190 mg/dL (10.0-10.5 mmol/L).  2 hours after drinking glucose: less than 155-165 mg/dL (8.6-9.2 mmol/L).  3 hours after drinking glucose: 140-145 mg/dL (7.8-8.1 mmol/L). What do the   results mean? Results within reference ranges are considered normal, meaning that your glucose levels are well-controlled. If two or more of your blood glucose levels are high, you may be diagnosed with gestational diabetes. If only one level is high, your health care  provider may suggest repeat testing or other tests to confirm a diagnosis. Talk with your health care provider about what your results mean. Questions to ask your health care provider Ask your health care provider, or the department that is doing the test:  When will my results be ready?  How will I get my results?  What are my treatment options?  What other tests do I need?  What are my next steps? Summary  The glucose tolerance test (GTT) is one of several tests used to diagnose diabetes that develops during pregnancy (gestational diabetes mellitus). Gestational diabetes is a temporary form of diabetes that some women develop during pregnancy.  You may have the GTT test after having a 1-hour glucose screening test if the results from that test indicate that you may have gestational diabetes. You may also have this test if you have any symptoms or risk factors for gestational diabetes.  Talk with your health care provider about what your results mean. This information is not intended to replace advice given to you by your health care provider. Make sure you discuss any questions you have with your health care provider. Document Revised: 01/16/2019 Document Reviewed: 05/07/2017 Elsevier Patient Education  2020 Elsevier Inc.  

## 2020-07-27 NOTE — Progress Notes (Signed)
ROB doing well. Feels good movement . Pt state she did not have a car and that is the reason for her lapse in care. She declines t dap today. Discussed 28 wk labs , she is concerned about having them done because last time she passed out. Reassured her that we would have her lay down the entire time , encouraged her to eat protein breakfast. She verbalizes and agrees . She will return this week for labs. She is interested in wate rbirth. Discussed requirements of class, tub attendant, tub rental or purchase. Material given for her to read, along with class scheduled. She will let us know if she would like to proceed. GBS and culture collected today . Follow up ROB with Michelle 1 wk.   Doreene Burke, CNM

## 2020-07-28 ENCOUNTER — Other Ambulatory Visit: Payer: Medicaid Other

## 2020-07-30 LAB — STREP GP B NAA: Strep Gp B NAA: NEGATIVE

## 2020-07-31 LAB — GC/CHLAMYDIA PROBE AMP
Chlamydia trachomatis, NAA: NEGATIVE
Neisseria Gonorrhoeae by PCR: NEGATIVE

## 2020-08-02 ENCOUNTER — Encounter: Payer: Medicaid Other | Admitting: Certified Nurse Midwife

## 2020-08-09 DIAGNOSIS — Z419 Encounter for procedure for purposes other than remedying health state, unspecified: Secondary | ICD-10-CM | POA: Diagnosis not present

## 2020-08-30 ENCOUNTER — Telehealth: Payer: Self-pay

## 2020-08-30 NOTE — Telephone Encounter (Signed)
Patient called in stating that today was her due date however the baby has not came yet and that she doesn't feel any contractions or anything like that. Patient would like to know what she should do. Could you please advise?

## 2020-08-30 NOTE — Telephone Encounter (Signed)
Per Serafina Royals, CNM called pt. number is unreachable.

## 2020-08-30 NOTE — Telephone Encounter (Signed)
mychart message sent

## 2020-08-31 ENCOUNTER — Telehealth: Payer: Self-pay

## 2020-08-31 ENCOUNTER — Other Ambulatory Visit (INDEPENDENT_AMBULATORY_CARE_PROVIDER_SITE_OTHER): Payer: Medicaid Other

## 2020-08-31 ENCOUNTER — Ambulatory Visit (INDEPENDENT_AMBULATORY_CARE_PROVIDER_SITE_OTHER): Payer: Medicaid Other | Admitting: Certified Nurse Midwife

## 2020-08-31 ENCOUNTER — Other Ambulatory Visit: Payer: Self-pay

## 2020-08-31 ENCOUNTER — Encounter: Payer: Self-pay | Admitting: Certified Nurse Midwife

## 2020-08-31 ENCOUNTER — Other Ambulatory Visit: Payer: Self-pay | Admitting: Certified Nurse Midwife

## 2020-08-31 VITALS — BP 130/88 | Wt 145.3 lb

## 2020-08-31 DIAGNOSIS — O0933 Supervision of pregnancy with insufficient antenatal care, third trimester: Secondary | ICD-10-CM | POA: Diagnosis not present

## 2020-08-31 DIAGNOSIS — Z3A40 40 weeks gestation of pregnancy: Secondary | ICD-10-CM

## 2020-08-31 DIAGNOSIS — Z09 Encounter for follow-up examination after completed treatment for conditions other than malignant neoplasm: Secondary | ICD-10-CM

## 2020-08-31 DIAGNOSIS — Z3483 Encounter for supervision of other normal pregnancy, third trimester: Secondary | ICD-10-CM | POA: Diagnosis not present

## 2020-08-31 LAB — POCT URINALYSIS DIPSTICK OB
Glucose, UA: NEGATIVE
Ketones, UA: NEGATIVE
Nitrite, UA: NEGATIVE
POC,PROTEIN,UA: NEGATIVE
Spec Grav, UA: 1.015 (ref 1.010–1.025)
Urobilinogen, UA: 0.2 E.U./dL
pH, UA: 7 (ref 5.0–8.0)

## 2020-08-31 NOTE — Telephone Encounter (Signed)
Per Doreene Burke, CNM called pt to inform she will need covid testing prior to her induction. Pt did not answer. Voicemail is not set up  [employer] sent.

## 2020-08-31 NOTE — Patient Instructions (Signed)

## 2020-08-31 NOTE — Progress Notes (Signed)
ROB , feels good fetal movement. Pt has has limited PNC, States she has had issues getitng here. Had GBs swab early due to hx of PTB, GBS repeated today.  U.s today for growth and AFI.See below for results, results reviewed with pt.  SVE 4/70/-2 station. Discussed induction, scheduled for 09/03/20@0500 . She agrees to plan.   NST due to AFI   Reactive Baseline 120's Accelerations present Decelerations absents Moderate variability   Contraction: occasional     Patient Name: Tracy [name] DOB: [birth year] MRN: [id number] ULTRASOUND REPORT  Location: [facility] Date of Service: 08/31/2020   Indications:growth/afi Findings:  Mason Jim intrauterine pregnancy is visualized with FHR at 119 BPM. Fetal presentation is Cephalic.  Placenta: anterior. Grade: 3 AFI: 3.6 cm  Growth percentile is 42. EFW: 3366 g ( 7 lbs 7 oz)   Impression: 1. [redacted]w[redacted]d Viable Singleton Intrauterine pregnancy previously established criteria. 2. Growth is 42 %ile.  AFI is 3.6 cm.   Recommendations: 1.Clinical correlation with the patient's History and Physical Exam.   Jenine  M. Marciano Sequin     RDMS

## 2020-09-02 LAB — STREP GP B NAA: Strep Gp B NAA: NEGATIVE

## 2020-09-03 ENCOUNTER — Encounter: Payer: Self-pay | Admitting: Certified Nurse Midwife

## 2020-09-03 ENCOUNTER — Other Ambulatory Visit: Payer: Self-pay

## 2020-09-03 ENCOUNTER — Inpatient Hospital Stay
Admission: EM | Admit: 2020-09-03 | Discharge: 2020-09-04 | DRG: 807 | Disposition: A | Discharge status: Home / Self Care | Payer: Medicaid Other | Attending: Certified Nurse Midwife | Admitting: Certified Nurse Midwife

## 2020-09-03 DIAGNOSIS — Z3A40 40 weeks gestation of pregnancy: Secondary | ICD-10-CM | POA: Diagnosis not present

## 2020-09-03 DIAGNOSIS — O480 Post-term pregnancy: Secondary | ICD-10-CM | POA: Diagnosis not present

## 2020-09-03 DIAGNOSIS — Z8616 Personal history of COVID-19: Secondary | ICD-10-CM

## 2020-09-03 DIAGNOSIS — O0933 Supervision of pregnancy with insufficient antenatal care, third trimester: Secondary | ICD-10-CM | POA: Diagnosis not present

## 2020-09-03 DIAGNOSIS — Z20822 Contact with and (suspected) exposure to covid-19: Secondary | ICD-10-CM | POA: Diagnosis present

## 2020-09-03 DIAGNOSIS — D509 Iron deficiency anemia, unspecified: Secondary | ICD-10-CM | POA: Diagnosis present

## 2020-09-03 DIAGNOSIS — O9902 Anemia complicating childbirth: Secondary | ICD-10-CM | POA: Diagnosis not present

## 2020-09-03 LAB — PROTEIN / CREATININE RATIO, URINE
Creatinine, Urine: 128 mg/dL
Protein Creatinine Ratio: 0.24 mg/mg{Cre} — ABNORMAL HIGH (ref 0.00–0.15)
Total Protein, Urine: 31 mg/dL

## 2020-09-03 LAB — COMPREHENSIVE METABOLIC PANEL
ALT: 9 U/L (ref 0–44)
AST: 15 U/L (ref 15–41)
Albumin: 3 g/dL — ABNORMAL LOW (ref 3.5–5.0)
Alkaline Phosphatase: 119 U/L (ref 38–126)
Anion gap: 11 (ref 5–15)
BUN: 6 mg/dL (ref 6–20)
CO2: 22 mmol/L (ref 22–32)
Calcium: 9 mg/dL (ref 8.9–10.3)
Chloride: 100 mmol/L (ref 98–111)
Creatinine, Ser: 0.66 mg/dL (ref 0.44–1.00)
GFR, Estimated: >60 mL/min (ref >60)
Glucose, Bld: 92 mg/dL (ref 70–99)
Potassium: 3.3 mmol/L — ABNORMAL LOW (ref 3.5–5.1)
Sodium: 133 mmol/L — ABNORMAL LOW (ref 135–145)
Total Bilirubin: 0.5 mg/dL (ref 0.3–1.2)
Total Protein: 6.3 g/dL — ABNORMAL LOW (ref 6.5–8.1)

## 2020-09-03 LAB — CBC
HCT: 27.7 % — ABNORMAL LOW (ref 36.0–46.0)
Hemoglobin: 9 g/dL — ABNORMAL LOW (ref 12.0–15.0)
MCH: 27.5 pg (ref 26.0–34.0)
MCHC: 32.5 g/dL (ref 30.0–36.0)
MCV: 84.7 fL (ref 80.0–100.0)
Platelets: 251 10*3/uL (ref 150–400)
RBC: 3.27 MIL/uL — ABNORMAL LOW (ref 3.87–5.11)
RDW: 13.9 % (ref 11.5–15.5)
WBC: 10.3 10*3/uL (ref 4.0–10.5)
nRBC: 0 % (ref 0.0–0.2)

## 2020-09-03 LAB — TYPE AND SCREEN
ABO/RH(D): O POS
Antibody Screen: NEGATIVE

## 2020-09-03 LAB — RESP PANEL BY RT-PCR (FLU A&B, COVID) ARPGX2
Influenza A by PCR: NEGATIVE
Influenza B by PCR: NEGATIVE
SARS Coronavirus 2 by RT PCR: NEGATIVE

## 2020-09-03 LAB — URINE DRUG SCREEN, QUALITATIVE (ARMC ONLY)
Amphetamines, Ur Screen: NOT DETECTED
Barbiturates, Ur Screen: NOT DETECTED
Benzodiazepine, Ur Scrn: NOT DETECTED
Cannabinoid 50 Ng, Ur ~~LOC~~: NOT DETECTED
Cocaine Metabolite,Ur ~~LOC~~: NOT DETECTED
MDMA (Ecstasy)Ur Screen: NOT DETECTED
Methadone Scn, Ur: NOT DETECTED
Opiate, Ur Screen: NOT DETECTED
Phencyclidine (PCP) Ur S: NOT DETECTED
Tricyclic, Ur Screen: NOT DETECTED

## 2020-09-03 LAB — RAPID HIV SCREEN (HIV 1/2 AB+AG)
HIV 1/2 Antibodies: NONREACTIVE
HIV-1 P24 Antigen - HIV24: NONREACTIVE

## 2020-09-03 MED ORDER — SIMETHICONE 80 MG PO CHEW: 80.0000 mg | CHEWABLE_TABLET | ORAL | Status: DC | PRN

## 2020-09-03 MED ORDER — TERBUTALINE SULFATE 1 MG/ML IJ SOLN: 0.2500 mg | Freq: Once | INTRAMUSCULAR | Status: DC | PRN

## 2020-09-03 MED ORDER — AMMONIA AROMATIC IN INHA
RESPIRATORY_TRACT | Status: AC
  Filled 2020-09-03: qty 10

## 2020-09-03 MED ORDER — SOD CITRATE-CITRIC ACID 500-334 MG/5ML PO SOLN: 30.0000 mL | ORAL | Status: DC | PRN

## 2020-09-03 MED ORDER — BENZOCAINE-MENTHOL 20-0.5 % EX AERO
1.0000 "application " | INHALATION_SPRAY | CUTANEOUS | Status: DC | PRN
  Filled 2020-09-03: qty 56

## 2020-09-03 MED ORDER — OXYTOCIN-SODIUM CHLORIDE 30-0.9 UT/500ML-% IV SOLN
2.5000 [IU]/h | INTRAVENOUS | Status: DC
  Administered 2020-09-03: 2.5 [IU]/h via INTRAVENOUS
  Filled 2020-09-03: qty 1000

## 2020-09-03 MED ORDER — MISOPROSTOL 200 MCG PO TABS
ORAL_TABLET | ORAL | Status: AC
  Administered 2020-09-03: 800 ug via RECTAL
  Filled 2020-09-03: qty 4

## 2020-09-03 MED ORDER — FENTANYL CITRATE (PF) 100 MCG/2ML IJ SOLN: 50.0000 ug | Freq: Once | INTRAMUSCULAR | Status: DC

## 2020-09-03 MED ORDER — BUTORPHANOL TARTRATE 1 MG/ML IJ SOLN: 1.0000 mg | INTRAMUSCULAR | Status: DC | PRN

## 2020-09-03 MED ORDER — OXYCODONE-ACETAMINOPHEN 5-325 MG PO TABS
1.0000 | ORAL_TABLET | ORAL | Status: DC | PRN
  Administered 2020-09-03: 1 via ORAL
  Filled 2020-09-03: qty 1

## 2020-09-03 MED ORDER — METHYLERGONOVINE MALEATE 0.2 MG/ML IJ SOLN: 0.2000 mg | Freq: Once | INTRAMUSCULAR | Status: DC

## 2020-09-03 MED ORDER — MISOPROSTOL 50MCG HALF TABLET
50.0000 ug | ORAL_TABLET | ORAL | Status: DC
  Administered 2020-09-03: 50 ug via VAGINAL
  Filled 2020-09-03: qty 1

## 2020-09-03 MED ORDER — LIDOCAINE HCL (PF) 1 % IJ SOLN: 30.0000 mL | INTRAMUSCULAR | Status: DC | PRN

## 2020-09-03 MED ORDER — METHYLERGONOVINE MALEATE 0.2 MG PO TABS
0.2000 mg | ORAL_TABLET | ORAL | Status: DC | PRN
  Filled 2020-09-03: qty 1

## 2020-09-03 MED ORDER — LACTATED RINGERS IV SOLN: INTRAVENOUS | Status: DC

## 2020-09-03 MED ORDER — ACETAMINOPHEN 325 MG PO TABS
650.0000 mg | ORAL_TABLET | ORAL | Status: DC | PRN
  Administered 2020-09-03 – 2020-09-04 (×2): 650 mg via ORAL
  Filled 2020-09-03: qty 2

## 2020-09-03 MED ORDER — OXYCODONE-ACETAMINOPHEN 5-325 MG PO TABS: 2.0000 | ORAL_TABLET | ORAL | Status: DC | PRN

## 2020-09-03 MED ORDER — COCONUT OIL OIL
1.0000 "application " | TOPICAL_OIL | Status: DC | PRN
  Administered 2020-09-03: 1 via TOPICAL
  Filled 2020-09-03: qty 120

## 2020-09-03 MED ORDER — DIBUCAINE (PERIANAL) 1 % EX OINT: 1.0000 "application " | TOPICAL_OINTMENT | CUTANEOUS | Status: DC | PRN

## 2020-09-03 MED ORDER — LACTATED RINGERS IV SOLN: 500.0000 mL | INTRAVENOUS | Status: DC | PRN

## 2020-09-03 MED ORDER — CARBOPROST TROMETHAMINE 250 MCG/ML IM SOLN
INTRAMUSCULAR | Status: AC
  Filled 2020-09-03: qty 1

## 2020-09-03 MED ORDER — MISOPROSTOL 200 MCG PO TABS
ORAL_TABLET | ORAL | Status: AC
  Filled 2020-09-03: qty 5

## 2020-09-03 MED ORDER — ONDANSETRON HCL 4 MG/2ML IJ SOLN: 4.0000 mg | INTRAMUSCULAR | Status: DC | PRN

## 2020-09-03 MED ORDER — IBUPROFEN 600 MG PO TABS
600.0000 mg | ORAL_TABLET | Freq: Four times a day (QID) | ORAL | Status: DC
  Administered 2020-09-03 – 2020-09-04 (×4): 600 mg via ORAL
  Filled 2020-09-03 (×4): qty 1

## 2020-09-03 MED ORDER — PRENATAL MULTIVITAMIN CH
1.0000 | ORAL_TABLET | Freq: Every day | ORAL | Status: DC
  Administered 2020-09-03 – 2020-09-04 (×2): 1 via ORAL
  Filled 2020-09-03 (×2): qty 1

## 2020-09-03 MED ORDER — ONDANSETRON HCL 4 MG PO TABS
4.0000 mg | ORAL_TABLET | ORAL | Status: DC | PRN
  Filled 2020-09-03: qty 1

## 2020-09-03 MED ORDER — FERROUS SULFATE 325 (65 FE) MG PO TABS
325.0000 mg | ORAL_TABLET | Freq: Every day | ORAL | Status: DC
  Administered 2020-09-04: 325 mg via ORAL
  Filled 2020-09-03: qty 1

## 2020-09-03 MED ORDER — LIDOCAINE HCL (PF) 1 % IJ SOLN
INTRAMUSCULAR | Status: AC
  Filled 2020-09-03: qty 30

## 2020-09-03 MED ORDER — FENTANYL CITRATE (PF) 100 MCG/2ML IJ SOLN
INTRAMUSCULAR | Status: AC
  Administered 2020-09-03: 50 ug
  Filled 2020-09-03: qty 2

## 2020-09-03 MED ORDER — DOCUSATE SODIUM 100 MG PO CAPS
100.0000 mg | ORAL_CAPSULE | Freq: Two times a day (BID) | ORAL | Status: DC
  Administered 2020-09-03 – 2020-09-04 (×2): 100 mg via ORAL
  Filled 2020-09-03 (×2): qty 1

## 2020-09-03 MED ORDER — WITCH HAZEL-GLYCERIN EX PADS
1.0000 "application " | MEDICATED_PAD | CUTANEOUS | Status: DC | PRN
  Administered 2020-09-03: 1 via TOPICAL
  Filled 2020-09-03: qty 100

## 2020-09-03 MED ORDER — OXYTOCIN BOLUS FROM INFUSION
333.0000 mL | Freq: Once | INTRAVENOUS | Status: AC
  Administered 2020-09-03: 333 mL via INTRAVENOUS

## 2020-09-03 MED ORDER — TETANUS-DIPHTH-ACELL PERTUSSIS 5-2.5-18.5 LF-MCG/0.5 IM SUSY
0.5000 mL | PREFILLED_SYRINGE | INTRAMUSCULAR | Status: DC | PRN
  Filled 2020-09-03: qty 0.5

## 2020-09-03 MED ORDER — METHYLERGONOVINE MALEATE 0.2 MG/ML IJ SOLN: 0.2000 mg | INTRAMUSCULAR | Status: DC | PRN

## 2020-09-03 MED ORDER — METHYLERGONOVINE MALEATE 0.2 MG/ML IJ SOLN
INTRAMUSCULAR | Status: AC
  Administered 2020-09-03: 0.2 mg
  Filled 2020-09-03: qty 1

## 2020-09-03 MED ORDER — ACETAMINOPHEN 325 MG PO TABS
650.0000 mg | ORAL_TABLET | ORAL | Status: DC | PRN
  Filled 2020-09-03: qty 2

## 2020-09-03 MED ORDER — ONDANSETRON HCL 4 MG/2ML IJ SOLN: 4.0000 mg | Freq: Four times a day (QID) | INTRAMUSCULAR | Status: DC | PRN

## 2020-09-03 MED ORDER — OXYTOCIN 10 UNIT/ML IJ SOLN
INTRAMUSCULAR | Status: AC
  Administered 2020-09-03: 10 [IU]
  Filled 2020-09-03: qty 2

## 2020-09-03 NOTE — H&P (Signed)
History and Physical   HPI  Tracy [name] is a 24 y.o. [id number] at [redacted]w[redacted]d Estimated Date of Delivery: 08/29/20 who is being admitted for induction of labor for post dates with limited prenatal care.    OB History  OB History  Gravida Para Term Preterm AB Living  4 1 0 1 2 1   SAB TAB Ectopic Multiple Live Births  0 2 0 0 1    # Outcome Date GA Lbr Len/2nd Weight Sex Delivery Anes PTL Lv  4 Current           3 TAB 2020          2 Preterm 03/16/17 [redacted]w[redacted]d / 00:21 1560 g F Vag-Spont None  LIV     Name: Blackerby,GIRL Ghada     Apgar1: 8  Apgar5: 8  1 TAB 2016        ND    PROBLEM LIST  Pregnancy complications or risks: Patient Active Problem List   Diagnosis Date Noted  . Labor and delivery, indication for care 07/21/2020  . Type O blood, Rh positive 05/31/2020  . History of preterm premature rupture of membranes (PPROM) 04/10/2020  . GBS bacteriuria 04/10/2020  . COVID-19 virus infection 06/01/2019    Prenatal labs and studies: ABO, Rh: --/--/PENDING (11/26 01-19-1990) Antibody: PENDING (11/26 0640) Rubella:   RPR: Non Reactive (06/01 1335)  HBsAg: Negative (06/01 1335)  HIV:    06-22-1988-- (11/23 1344)   Past Medical History:  Diagnosis Date  . Abortion history   . COVID-19 virus infection 06/01/2019  . Headache    chronic and migraines  . Heart murmur   . Irregular periods    history     Past Surgical History:  Procedure Laterality Date  . adnoids    . DILATION AND EVACUATION N/A 06/01/2019   Procedure: DILATATION AND EVACUATION;  Surgeon: 06/03/2019, MD;  Location: [facility];  Service: Gynecology;  Laterality: N/A;  . WISDOM TOOTH EXTRACTION       Medications    Current Discharge Medication List    CONTINUE these medications which have NOT CHANGED   Details  Prenatal Vit-Fe Fumarate-FA (PRENATAL MULTIVITAMIN) TABS tablet Take 1 tablet by mouth daily at 12 noon.         Allergies  Patient has no known allergies.  Review of  Systems  Constitutional: negative Eyes: negative Ears, nose, mouth, throat, and face: negative Respiratory: negative Cardiovascular: negative Gastrointestinal: negative Genitourinary:negative Integument/breast: negative Hematologic/lymphatic: negative Musculoskeletal:negative Neurological: negative Behavioral/Psych: negative Endocrine: negative Allergic/Immunologic: negative  Physical Exam  BP (!) 156/86   Pulse 69   Temp 97.9 F (36.6 C) (Oral)   Resp 16   Ht 5\' 2"  (1.575 m)   Wt 63.5 kg   LMP  (LMP Unknown)   BMI 25.61 kg/m   Lungs:  CTA B Cardio: RRR Abd: Soft, gravid, NT Presentation: cephalic EXT: No C/C/ 1+ Edema DTRs: 2+ B CERVIX: Dilation: 5 Effacement (%): 70 Station: -2 Exam by:: A. , CNM  See Prenatal records for more detailed PE.     FHR:  Baseline: 130 bpm, Variability: Good {> 6 bpm), Accelerations: Reactive and Decelerations: Absent  Toco: Uterine Contractions: Frequency: Every 2-5 minutes and Intensity: mild  Test Results  Results for orders placed or performed during the hospital encounter of 09/03/20 (from the past 24 hour(s))  Type and screen     Status: None (Preliminary result)   Collection Time: 09/03/20  5:32 AM  Result Value  Ref Range   ABO/RH(D) PENDING    Antibody Screen PENDING    Sample Expiration      09/06/2020,2359 Performed at [facility], [address] [place], [place] [address]   Resp Panel by RT-PCR (Flu A&B, Covid) Nasopharyngeal Swab     Status: None   Collection Time: 09/03/20  5:41 AM   Specimen: Nasopharyngeal Swab; Nasopharyngeal(NP) swabs in vial transport medium  Result Value Ref Range   SARS Coronavirus 2 by RT PCR NEGATIVE NEGATIVE   Influenza A by PCR NEGATIVE NEGATIVE   Influenza B by PCR NEGATIVE NEGATIVE  CBC     Status: Abnormal   Collection Time: 09/03/20  6:00 AM  Result Value Ref Range   WBC 10.3 4.0 - 10.5 K/uL   RBC 3.27 (L) 3.87 - 5.11 MIL/uL   Hemoglobin 9.0 (L)  12.0 - 15.0 g/dL   HCT 96.2 (L) 36 - 46 %   MCV 84.7 80.0 - 100.0 fL   MCH 27.5 26.0 - 34.0 pg   MCHC 32.5 30.0 - 36.0 g/dL   RDW 95.2 84.1 - 32.4 %   Platelets 251 150 - 400 K/uL   nRBC 0.0 0.0 - 0.2 %  Type and screen     Status: None (Preliminary result)   Collection Time: 09/03/20  6:40 AM  Result Value Ref Range   ABO/RH(D) PENDING    Antibody Screen PENDING    Sample Expiration      09/06/2020,2359 Performed at [facility], [address] [place], [place] [address]    Group B Strep negative  Assessment   [id number] at [redacted]w[redacted]d Estimated Date of Delivery: 08/29/20  The fetus is reassuring.   Patient Active Problem List   Diagnosis Date Noted  . Labor and delivery, indication for care 07/21/2020  . Type O blood, Rh positive 05/31/2020  . History of preterm premature rupture of membranes (PPROM) 04/10/2020  . GBS bacteriuria 04/10/2020  . COVID-19 virus infection 06/01/2019    Plan  1. Admitted to L&D :   2. EFM:-- Category 1 3. Stadol or Epidural if desired.   4. Admission labs completed 5. Dr. Valentino Saxon aware of admission and plan of care and is in agreement 6.Anticipate NSVD  Doreene Burke, CNM  09/03/2020 8:17 AM

## 2020-09-04 LAB — CBC
HCT: 21.2 % — ABNORMAL LOW (ref 36.0–46.0)
Hemoglobin: 6.9 g/dL — ABNORMAL LOW (ref 12.0–15.0)
MCH: 27.6 pg (ref 26.0–34.0)
MCHC: 32.5 g/dL (ref 30.0–36.0)
MCV: 84.8 fL (ref 80.0–100.0)
Platelets: 200 10*3/uL (ref 150–400)
RBC: 2.5 MIL/uL — ABNORMAL LOW (ref 3.87–5.11)
RDW: 14 % (ref 11.5–15.5)
WBC: 10.3 10*3/uL (ref 4.0–10.5)
nRBC: 0 % (ref 0.0–0.2)

## 2020-09-04 LAB — MEASLES/MUMPS/RUBELLA IMMUNITY
Mumps IgG: <9 AU/mL — ABNORMAL LOW (ref >10.9)
Rubella: <0.9 index — ABNORMAL LOW (ref >0.99)
Rubeola IgG: 90.1 AU/mL (ref >16.4)

## 2020-09-04 LAB — RPR: RPR Ser Ql: NONREACTIVE

## 2020-09-04 MED ORDER — SODIUM CHLORIDE 0.9 % IV SOLN: INTRAVENOUS | Status: DC | PRN

## 2020-09-04 MED ORDER — FERROUS SULFATE 325 (65 FE) MG PO TABS: 325.0000 mg | ORAL_TABLET | Freq: Every day | ORAL | 3 refills | Status: AC

## 2020-09-04 MED ORDER — SODIUM CHLORIDE 0.9 % IV SOLN
25.0000 mg | Freq: Once | INTRAVENOUS | Status: AC
  Administered 2020-09-04: 25 mg via INTRAVENOUS
  Filled 2020-09-04: qty 0.5

## 2020-09-04 MED ORDER — SODIUM CHLORIDE 0.9 % IV SOLN
500.0000 mg | Freq: Once | INTRAVENOUS | Status: AC
  Administered 2020-09-04: 500 mg via INTRAVENOUS
  Filled 2020-09-04: qty 10

## 2020-09-04 MED ORDER — IBUPROFEN 600 MG PO TABS: 600.0000 mg | ORAL_TABLET | Freq: Four times a day (QID) | ORAL | 0 refills | Status: AC

## 2020-09-04 NOTE — Discharge Instructions (Signed)
Discharge Instructions:   Follow-up Appointment: Call ASAP Monday morning and schedule a follow-up appointment for a postpartum visit with Doreene Burke, CNM!  If there are any new medications, they have been ordered and will be available for pickup at the listed pharmacy on your way home from the hospital.   Call office if you have any of the following: headache, visual changes, fever >101.0 F, chills, shortness of breath, breast concerns, excessive vaginal bleeding, incision drainage or problems, leg pain or redness, depression or any other concerns. If you have vaginal discharge with an odor, let your doctor know.   It is normal to bleed for up to 6 weeks. You should not soak through more than 1 pad in 1 hour. If you have a blood clot larger than your fist with continued bleeding, call your doctor.   Activity: Do not lift > 10 lbs for 6 weeks (do not lift anything heavier than your baby). No intercourse, tampons, swimming pools, hot tubs, baths (only showers) for 6 weeks.  No driving for 1-2 weeks. Continue prenatal vitamin, especially if breastfeeding. Increase calories and fluids (water) while breastfeeding.   Your milk will come in, in the next couple of days (right now it is colostrum). You may have a slight fever when your milk comes in, but it should go away on its own.  If it does not, and rises above 101 F please call the doctor. You will also feel achy and your breasts will be firm. They will also start to leak. If you are breastfeeding, continue as you have been and you can pump/express milk for comfort.   If you have too much milk, your breasts can become engorged, which could lead to mastitis. This is an infection of the milk ducts. It can be very painful and you will need to notify your doctor to obtain a prescription for antibiotics. You can also treat it with a shower or hot/cold compress.   For concerns about your baby, please call your pediatrician.  For breastfeeding  concerns, the lactation consultant can be reached at [phone].   Postpartum blues (feelings of happy one minute and sad another minute) are normal for the first few weeks but if it gets worse let your doctor know.   Congratulations! We enjoyed caring for you and your new bundle of joy!

## 2020-09-04 NOTE — Progress Notes (Signed)
Iron infusion given. Pt tolerated well.

## 2020-09-04 NOTE — Discharge Summary (Signed)
Patient Name: Tracy [name] DOB: 1995/10/22 MRN: [id number]                            Discharge Summary  Date of Admission: 09/03/2020 Date of Discharge: 09/04/2020 Delivering Provider: Doreene Burke   Admitting Diagnosis: Labor and delivery, indication for care [O75.9] at [redacted]w[redacted]d Secondary diagnosis:  Active Problems:   Limited prenatal care in third trimester   Labor and delivery, indication for care   Mode of Delivery: normal spontaneous vaginal delivery              Discharge diagnosis: Term Pregnancy Delivered and Anemia      Intrapartum Procedures: AROM   Post partum procedures: iron transfusion   Complications: none                     Discharge Day SOAP Note:  Progress Note - Vaginal Delivery  Tracy [name] is a 24 y.o. [id number] now PP day 1 s/p Vaginal, Spontaneous . Delivery was complicated by anemia, limited prenatal care  Subjective  The patient has the following complaints: has no unusual complaints  Pain is controlled with current medications.   Patient is urinating without difficulty.  She is ambulating well.  She is not complaining of dizziness, SOB, she is ambulating well.    Objective  Vital signs: BP (!) 142/64 (BP Location: Left Arm)    Pulse 67    Temp 98.2 F (36.8 C) (Oral)    Resp 18    Ht 5\' 2"  (1.575 m)    Wt 63.5 kg    LMP  (LMP Unknown)    SpO2 99%    Breastfeeding Unknown    BMI 25.61 kg/m   Physical Exam: Gen: NAD Fundus Fundal Tone: Firm  Lochia Amount: Small        Data Review Labs: Lab Results  Component Value Date   WBC 10.3 09/04/2020   HGB 6.9 (L) 09/04/2020   HCT 21.2 (L) 09/04/2020   MCV 84.8 09/04/2020   PLT 200 09/04/2020   CBC Latest Ref Rng & Units 09/04/2020 09/03/2020 03/09/2020  WBC 4.0 - 10.5 K/uL 10.3 10.3 9.1  Hemoglobin 12.0 - 15.0 g/dL 6.9(L) 9.0(L) 11.3  Hematocrit 36 - 46 % 21.2(L) 27.7(L) 33.7(L)  Platelets 150 - 400 K/uL 200 251 -   O POS  Edinburgh Score: Edinburgh Postnatal  Depression Scale Screening Tool 09/03/2020  I have been able to laugh and see the funny side of things. 0  I have looked forward with enjoyment to things. 0  I have blamed myself unnecessarily when things went wrong. 1  I have been anxious or worried for no good reason. 1  I have felt scared or panicky for no good reason. 1  Things have been getting on top of me. 0  I have been so unhappy that I have had difficulty sleeping. 0  I have felt sad or miserable. 0  I have been so unhappy that I have been crying. 0  The thought of harming myself has occurred to me. 0  Edinburgh Postnatal Depression Scale Total 3    Assessment/Plan  Active Problems:   Limited prenatal care in third trimester   Labor and delivery, indication for care    Plan for discharge today.  Discharge Instructions: Per After Visit Summary. Activity: Advance as tolerated. Pelvic rest for 6 weeks.  Also refer to After Visit Summary Diet:  Regular Medications: Allergies as of 09/04/2020   No Known Allergies     Medication List    TAKE these medications   ferrous sulfate 325 (65 FE) MG tablet Take 1 tablet (325 mg total) by mouth daily with breakfast. Start taking on: September 05, 2020   ibuprofen 600 MG tablet Commonly known as: ADVIL Take 1 tablet (600 mg total) by mouth every 6 (six) hours.   prenatal multivitamin Tabs tablet Take 1 tablet by mouth daily at 12 noon.      Outpatient follow up: tele visit 2 wks , postpartum office visit @ 6 wks with Doreene Burke, CNM  Postpartum contraception: IS interested in tubal ligation , will discuss further at 6 wk ppv  Discharged Condition: good  Discharged to: home  Newborn Data: Disposition:home with mother  Apgars: APGAR (1 MIN): 8   APGAR (5 MINS): 9   APGAR (10 MINS):    Baby Feeding: Breast    Doreene Burke, CNM  09/04/2020 8:54 AM

## 2020-09-04 NOTE — Final Progress Note (Signed)
Discharge Day SOAP Note:  Progress Note - Vaginal Delivery  Tracy [name] is a 24 y.o. [id number] now PP day 1 s/p Vaginal, Spontaneous . Delivery was complicated by anemia, limited prenatal care  Subjective  The patient has the following complaints: has no unusual complaints  Pain is controlled with current medications.   Patient is urinating without difficulty.  She is ambulating well.  She is not complaining of dizziness, SOB, she is ambulating well.    Objective  Vital signs: BP (!) 142/64 (BP Location: Left Arm)   Pulse 67   Temp 98.2 F (36.8 C) (Oral)   Resp 18   Ht 5\' 2"  (1.575 m)   Wt 63.5 kg   LMP  (LMP Unknown)   SpO2 99%   Breastfeeding Unknown   BMI 25.61 kg/m   Physical Exam: Gen: NAD Fundus Fundal Tone: Firm  Lochia Amount: Small        Data Review Labs: Lab Results  Component Value Date   WBC 10.3 09/04/2020   HGB 6.9 (L) 09/04/2020   HCT 21.2 (L) 09/04/2020   MCV 84.8 09/04/2020   PLT 200 09/04/2020   CBC Latest Ref Rng & Units 09/04/2020 09/03/2020 03/09/2020  WBC 4.0 - 10.5 K/uL 10.3 10.3 9.1  Hemoglobin 12.0 - 15.0 g/dL 6.9(L) 9.0(L) 11.3  Hematocrit 36 - 46 % 21.2(L) 27.7(L) 33.7(L)  Platelets 150 - 400 K/uL 200 251 -   O POS  Edinburgh Score: Edinburgh Postnatal Depression Scale Screening Tool 09/03/2020  I have been able to laugh and see the funny side of things. 0  I have looked forward with enjoyment to things. 0  I have blamed myself unnecessarily when things went wrong. 1  I have been anxious or worried for no good reason. 1  I have felt scared or panicky for no good reason. 1  Things have been getting on top of me. 0  I have been so unhappy that I have had difficulty sleeping. 0  I have felt sad or miserable. 0  I have been so unhappy that I have been crying. 0  The thought of harming myself has occurred to me. 0  Edinburgh Postnatal Depression Scale Total 3    Assessment/Plan  Active Problems:    Limited prenatal care in third trimester   Labor and delivery, indication for care    Plan for discharge today.  Discharge Instructions: Per After Visit Summary. Activity: Advance as tolerated. Pelvic rest for 6 weeks.  Also refer to After Visit Summary Diet: Regular Medications: Allergies as of 09/04/2020   No Known Allergies     Medication List    TAKE these medications   ferrous sulfate 325 (65 FE) MG tablet Take 1 tablet (325 mg total) by mouth daily with breakfast. Start taking on: September 05, 2020   ibuprofen 600 MG tablet Commonly known as: ADVIL Take 1 tablet (600 mg total) by mouth every 6 (six) hours.   prenatal multivitamin Tabs tablet Take 1 tablet by mouth daily at 12 noon.      Outpatient follow up: tele visit 2 wks , postpartum office visit @ 6 wks with September 07, 2020, CNM  Postpartum contraception: IS interested in tubal ligation , will discuss further at 6 wk ppv  Discharged Condition: good  Discharged to: home  Newborn Data: Disposition:home with mother  Apgars: APGAR (1 MIN): 8   APGAR (5 MINS): 9   APGAR (10 MINS):    Baby Feeding: Breast    Doreene Burke  Janee Morn, CNM  09/04/2020 8:54 AM

## 2020-09-04 NOTE — Progress Notes (Signed)
Patient discharged home with infant. FOB present at discharge. Discharge instructions and prescriptions given and reviewed with patient. Patient verbalized understanding.   Explained importance of calling and making follow-up appointments including a 2-week televisit and 6-week postpartum visit. Pt verbalized understanding.   Escorted out by volunteers.

## 2020-09-06 ENCOUNTER — Telehealth: Payer: Self-pay

## 2020-09-06 NOTE — Telephone Encounter (Signed)
Transition Care Management Unsuccessful Follow-up Telephone Call  Date of discharge and from where:  09/04/2020 [facility]  Attempts:  1st Attempt  Reason for unsuccessful TCM follow-up call:  Unable to reach patient

## 2020-09-07 NOTE — Telephone Encounter (Signed)
Transition Care Management Unsuccessful Follow-up Telephone Call  Date of discharge and from where:  09/04/2020 [facility] Attempts:  2nd Attempt  Reason for unsuccessful TCM follow-up call:  Unable to reach patient

## 2020-09-08 DIAGNOSIS — Z419 Encounter for procedure for purposes other than remedying health state, unspecified: Secondary | ICD-10-CM | POA: Diagnosis not present

## 2020-09-08 NOTE — Telephone Encounter (Signed)
Transition Care Management Unsuccessful Follow-up Telephone Call  Date of discharge and from where:  11/27/2021[facility]  Attempts:  3rd Attempt  Reason for unsuccessful TCM follow-up call:  Unable to leave message

## 2020-10-09 DIAGNOSIS — Z419 Encounter for procedure for purposes other than remedying health state, unspecified: Secondary | ICD-10-CM | POA: Diagnosis not present

## 2020-10-12 ENCOUNTER — Encounter: Payer: Medicaid Other | Admitting: Certified Nurse Midwife

## 2020-10-27 ENCOUNTER — Encounter: Payer: Medicaid Other | Admitting: Certified Nurse Midwife

## 2020-11-09 DIAGNOSIS — Z419 Encounter for procedure for purposes other than remedying health state, unspecified: Secondary | ICD-10-CM | POA: Diagnosis not present

## 2020-12-07 DIAGNOSIS — Z419 Encounter for procedure for purposes other than remedying health state, unspecified: Secondary | ICD-10-CM | POA: Diagnosis not present

## 2021-01-07 DIAGNOSIS — Z419 Encounter for procedure for purposes other than remedying health state, unspecified: Secondary | ICD-10-CM | POA: Diagnosis not present

## 2021-02-06 DIAGNOSIS — Z419 Encounter for procedure for purposes other than remedying health state, unspecified: Secondary | ICD-10-CM | POA: Diagnosis not present

## 2021-03-09 DIAGNOSIS — Z419 Encounter for procedure for purposes other than remedying health state, unspecified: Secondary | ICD-10-CM | POA: Diagnosis not present

## 2021-04-08 DIAGNOSIS — Z419 Encounter for procedure for purposes other than remedying health state, unspecified: Secondary | ICD-10-CM | POA: Diagnosis not present

## 2021-04-25 DIAGNOSIS — J40 Bronchitis, not specified as acute or chronic: Secondary | ICD-10-CM | POA: Diagnosis not present

## 2021-04-25 DIAGNOSIS — Z20822 Contact with and (suspected) exposure to covid-19: Secondary | ICD-10-CM | POA: Diagnosis not present

## 2021-05-09 DIAGNOSIS — Z419 Encounter for procedure for purposes other than remedying health state, unspecified: Secondary | ICD-10-CM | POA: Diagnosis not present

## 2021-06-09 DIAGNOSIS — Z419 Encounter for procedure for purposes other than remedying health state, unspecified: Secondary | ICD-10-CM | POA: Diagnosis not present

## 2021-06-15 DIAGNOSIS — Z30014 Encounter for initial prescription of intrauterine contraceptive device: Secondary | ICD-10-CM | POA: Diagnosis not present

## 2021-06-18 IMAGING — US US PELVIS COMPLETE TRANSABD/TRANSVAG W DUPLEX
1 series · 13 of 25 positions shown · non-contrast
Comparison: None

CLINICAL DATA: Pelvic pain, postabortion 1 month ago. Question
retained products of conception

EXAM:
TRANSABDOMINAL AND TRANSVAGINAL ULTRASOUND OF PELVIS
DOPPLER ULTRASOUND OF OVARIES
TECHNIQUE: Both transabdominal and transvaginal ultrasound examinations of the
pelvis were performed. Transabdominal technique was performed for
global imaging of the pelvis including uterus, ovaries, adnexal
regions, and pelvic cul-de-sac.
It was necessary to proceed with endovaginal exam following the
transabdominal exam to visualize the uterus, endometrium, ovaries
and adnexa. Color and duplex Doppler ultrasound was utilized to
evaluate blood flow to the ovaries.

[Series 1: us pelvis complete transabd/transvag w duplex · 69 acquisitions, 13 frames shown]
[im 1/69]
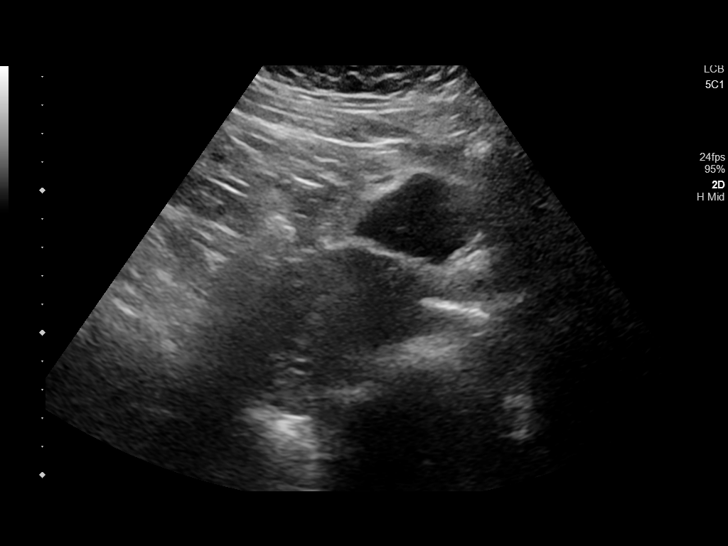
[im 6/69]
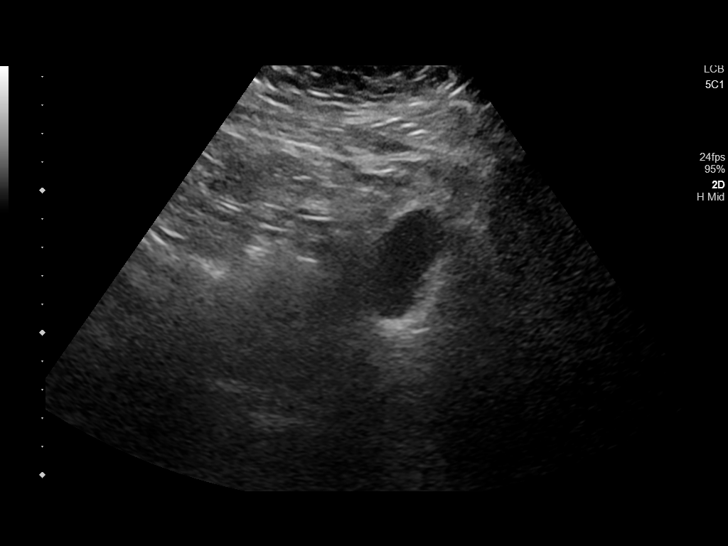
[im 12/69]
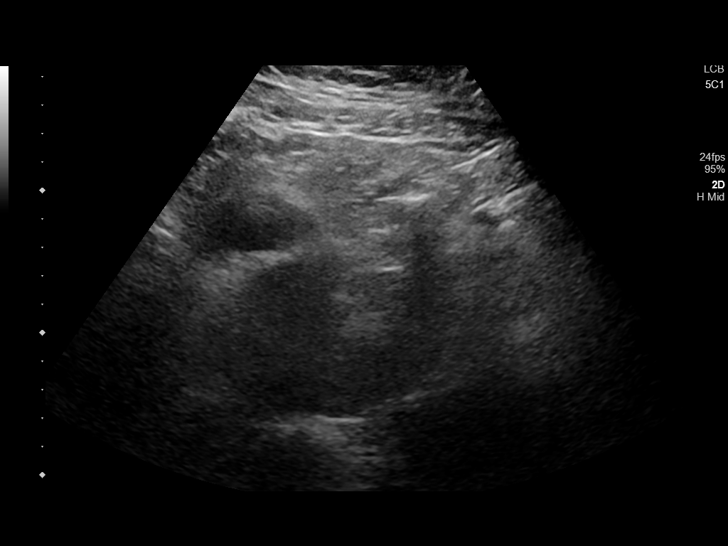
[im 18/69]
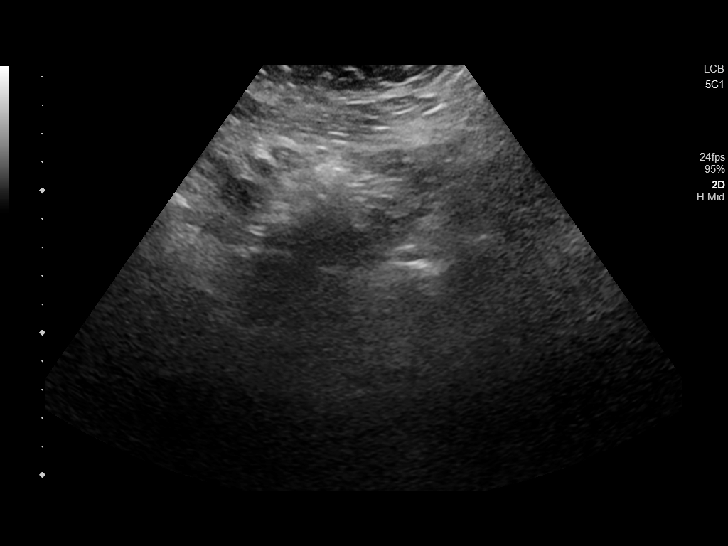
[im 23/69]
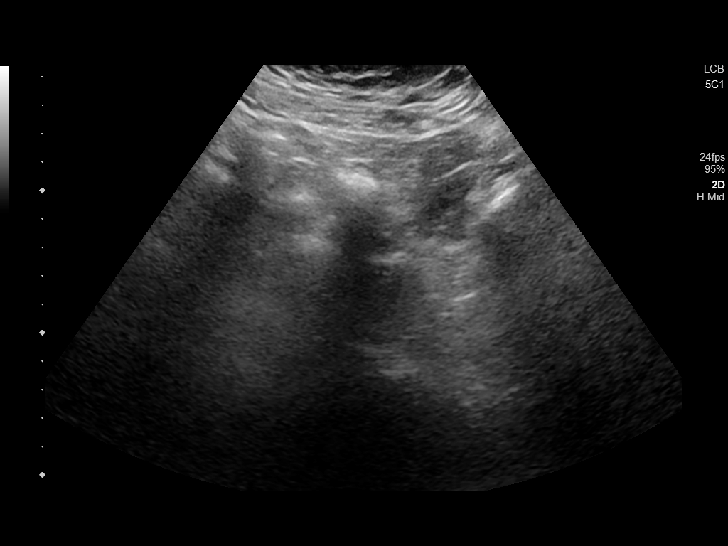
[im 29/69]
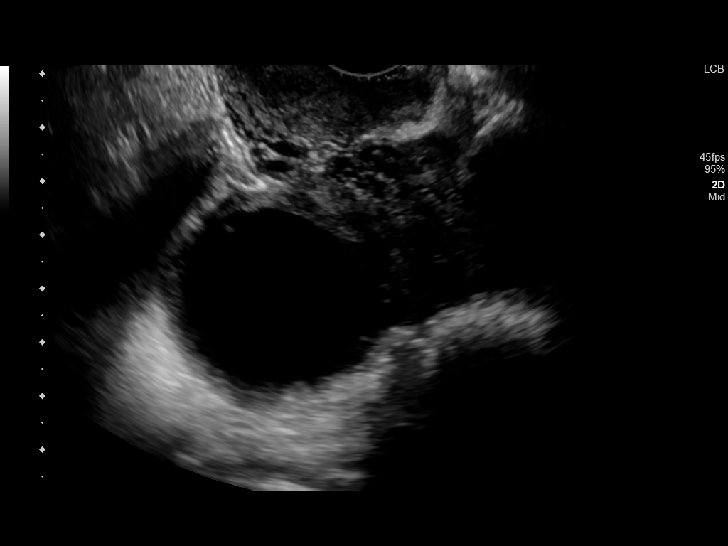
[im 35/69]
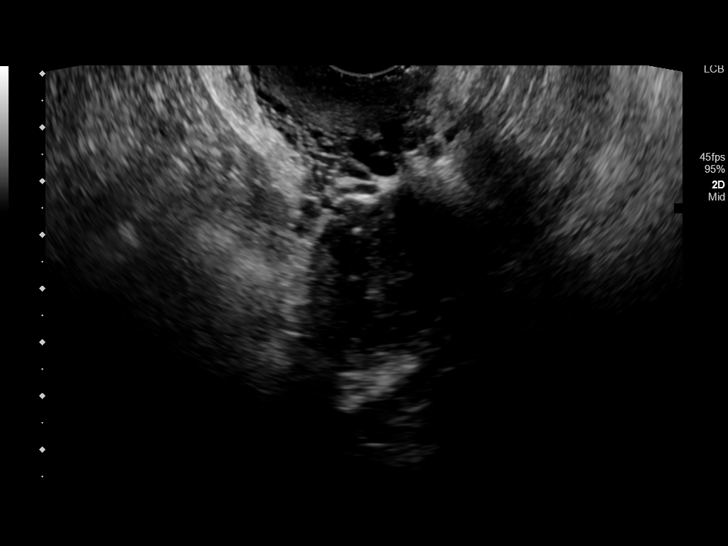
[im 40/69]
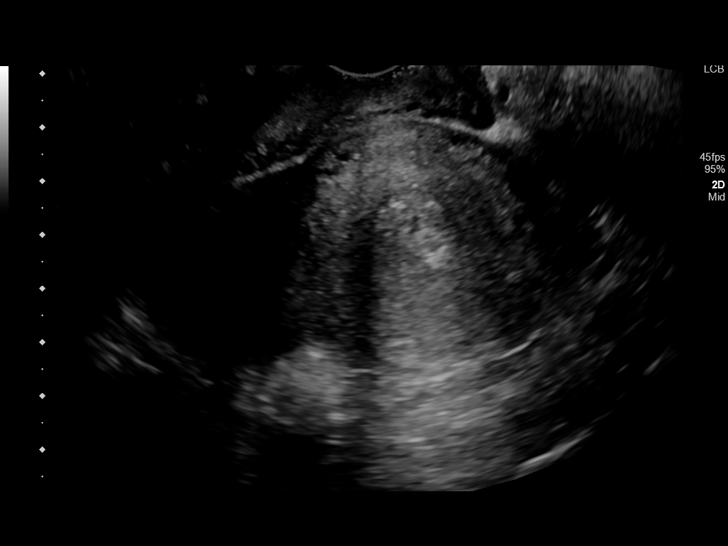
[im 46/69]
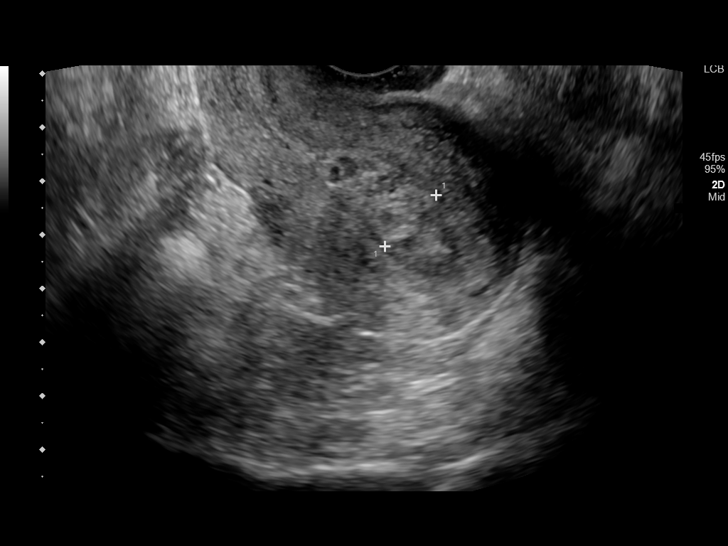
[im 52/69]
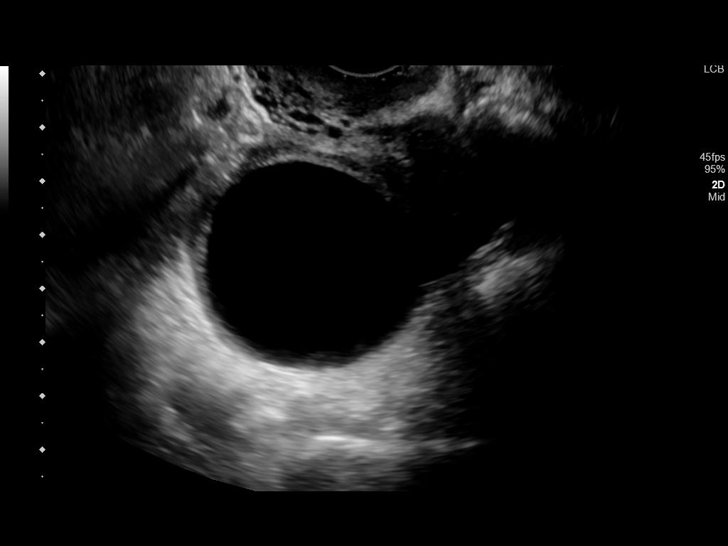
[im 57/69]
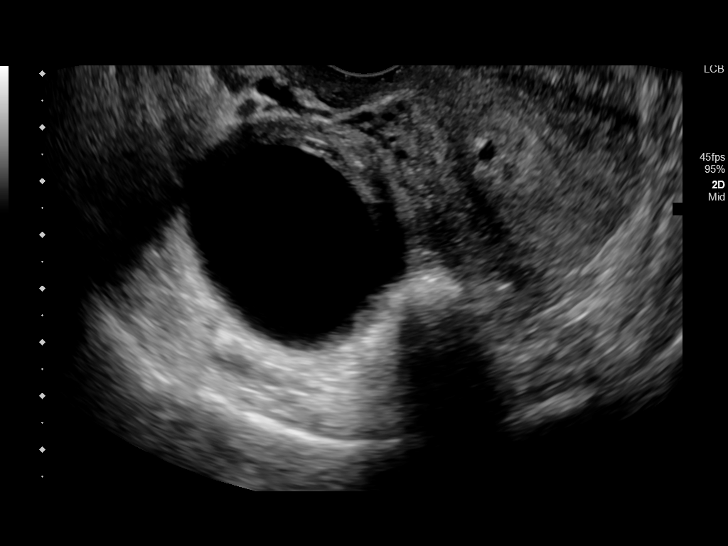
[im 63/69]
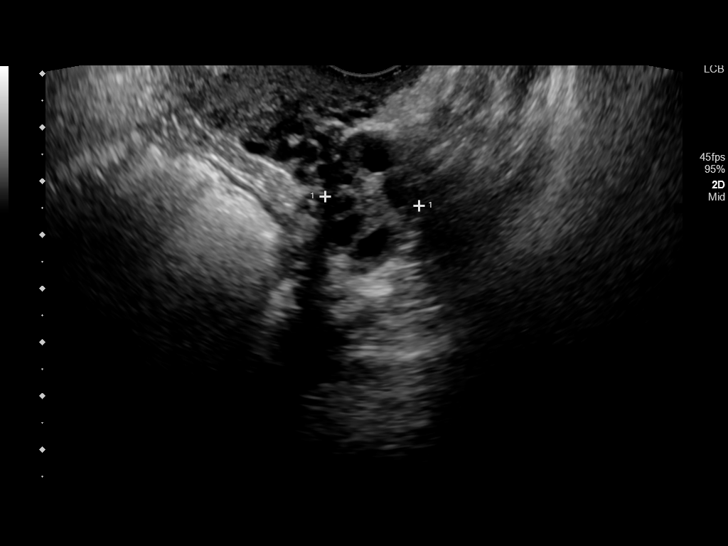
[im 69/69]
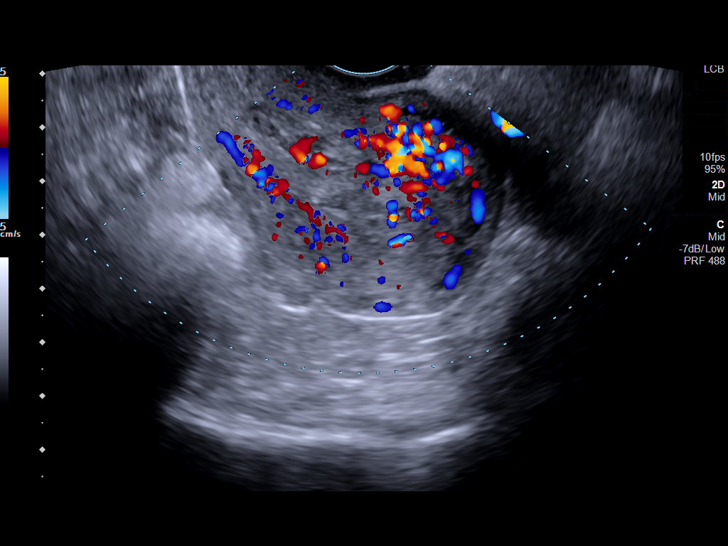

[13 of 25 positions shown; findings below may reference images not displayed]

FINDINGS: Uterus

Measurements: 7.5 x 4.3 x 5.3 cm = volume: 89 mL. No fibroids or
other mass visualized.

Endometrium

Thickness: 14 mm in thickness, heterogeneous. Areas of internal
blood flow.

Right ovary

Measurements: 4.7 x 4.1 x 3.9 cm = volume: 40 mL. 4.2 cm cyst
appears simple

Left ovary

Measurements: 3.3 x 2.6 x 1.6 cm = volume: 7 mL. Normal
appearance/no adnexal mass.

Pulsed Doppler evaluation of both ovaries demonstrates normal
low-resistance arterial and venous waveforms.

Other findings

No abnormal free fluid.
IMPRESSION: Thickened, heterogeneous endometrium with some areas of internal
blood flow. Findings concerning for retained products of conception.

4 cm simple appearing right ovarian cyst

## 2021-07-09 DIAGNOSIS — Z419 Encounter for procedure for purposes other than remedying health state, unspecified: Secondary | ICD-10-CM | POA: Diagnosis not present

## 2021-08-09 DIAGNOSIS — Z419 Encounter for procedure for purposes other than remedying health state, unspecified: Secondary | ICD-10-CM | POA: Diagnosis not present

## 2021-09-08 DIAGNOSIS — Z419 Encounter for procedure for purposes other than remedying health state, unspecified: Secondary | ICD-10-CM | POA: Diagnosis not present

## 2021-10-09 DIAGNOSIS — Z419 Encounter for procedure for purposes other than remedying health state, unspecified: Secondary | ICD-10-CM | POA: Diagnosis not present

## 2021-10-24 ENCOUNTER — Other Ambulatory Visit: Payer: Self-pay

## 2021-10-24 ENCOUNTER — Encounter: Payer: Self-pay | Admitting: Certified Nurse Midwife

## 2021-10-24 ENCOUNTER — Ambulatory Visit (INDEPENDENT_AMBULATORY_CARE_PROVIDER_SITE_OTHER): Payer: Medicaid Other | Admitting: Certified Nurse Midwife

## 2021-10-24 VITALS — BP 124/83 | HR 112 | Ht 62.0 in | Wt 140.8 lb

## 2021-10-24 DIAGNOSIS — Z30430 Encounter for insertion of intrauterine contraceptive device: Secondary | ICD-10-CM | POA: Diagnosis not present

## 2021-10-24 LAB — POCT URINE PREGNANCY: Preg Test, Ur: NEGATIVE

## 2021-10-24 NOTE — Progress Notes (Signed)
°  Subjective:    Tracy [name] is a 26 y.o. female who presents for contraception counseling. The patient has no complaints today. The patient is sexually active. Pertinent past medical history: none. She states she had the para guard IUD in place but it fell out in the shower Dec 22nd. She would like to have a new IUD placed. She had intercourse unprotected a week ago.   Menstrual History: OB History     Gravida  4   Para  2   Term  1   Preterm  1   AB  2   Living  2      SAB      IAB  2   Ectopic      Multiple  0   Live Births  2            Patient's last menstrual period was 09/27/2021 (approximate).    The following portions of the patient's history were reviewed and updated as appropriate: allergies, current medications, past family history, past medical history, past social history, past surgical history, and problem list.  Review of Systems Pertinent items are noted in HPI.   Objective:    No exam performed today,  not indicated .   Assessment:    26 y.o., starting IUD,  plans para gaurd .   Plan:    Given pt recent history of unprotected intercourse recommend beta HCG level if negative she may return later this week for IUD placement.  She verbalizes and agrees to plan.   Philip Aspen, CNM

## 2021-10-25 ENCOUNTER — Encounter: Payer: Self-pay | Admitting: Certified Nurse Midwife

## 2021-10-25 ENCOUNTER — Ambulatory Visit (INDEPENDENT_AMBULATORY_CARE_PROVIDER_SITE_OTHER): Payer: Medicaid Other | Admitting: Certified Nurse Midwife

## 2021-10-25 VITALS — BP 135/87 | HR 120 | Ht 61.0 in | Wt 141.7 lb

## 2021-10-25 DIAGNOSIS — Z30430 Encounter for insertion of intrauterine contraceptive device: Secondary | ICD-10-CM | POA: Diagnosis not present

## 2021-10-25 LAB — BETA HCG QUANT (REF LAB): hCG Quant: <1 m[IU]/mL

## 2021-10-25 NOTE — Progress Notes (Signed)
° °  GYNECOLOGY OFFICE PROCEDURE NOTE  BRYELLE SPIEWAK is a 26 y.o. [id number] here for para Gaurd IUD insertion. No GYN concerns.  Last pap smear was on 03/09/2020 and was normal.  IUD Insertion Procedure Note  Patient identified, informed consent performed, consent signed.   Discussed risks of irregular bleeding, cramping, infection, malpositioning or misplacement of the IUD outside the uterus which may require further procedure such as laparoscopy. Also discussed >99% contraception efficacy, increased risk of ectopic pregnancy with failure of method.   Emphasized that this did not protect against STIs, condoms recommended during all sexual encounters. Time out was performed.  Urine pregnancy test negative.  Speculum placed in the vagina.  Cervix visualized.  Cleaned with Betadine x 2.  Grasped anteriorly with a single tooth tenaculum.  Uterus sounded to 8 cm.  Para Guard IUD placed per manufacturer's recommendations.  Strings trimmed to 3 cm. Tenaculum was removed, good hemostasis noted.  Patient tolerated procedure well.   Patient was given post-procedure instructions. She was advised to have backup contraception for one week.  Patient was also asked to check IUD strings periodically and follow up in 4 weeks for IUD check.   Doreene Burke, CNM

## 2021-10-25 NOTE — Patient Instructions (Signed)

## 2021-11-09 DIAGNOSIS — Z419 Encounter for procedure for purposes other than remedying health state, unspecified: Secondary | ICD-10-CM | POA: Diagnosis not present

## 2021-11-10 ENCOUNTER — Telehealth: Payer: Self-pay

## 2021-11-11 ENCOUNTER — Other Ambulatory Visit: Payer: Self-pay

## 2021-11-11 ENCOUNTER — Other Ambulatory Visit: Payer: Medicaid Other

## 2021-11-11 DIAGNOSIS — Z3200 Encounter for pregnancy test, result unknown: Secondary | ICD-10-CM

## 2021-11-11 NOTE — Telephone Encounter (Signed)
Spoke with DJE in regards to advising as AT was out of office. Pt will be coming in this afternoon 11/11/21 for beta hcg blood work to confirm pregnancy; states her positive at home test was taken on 11/08/21. Patient verbalized that she does not want to go through with the pregnancy if blood work confirms she is pregnant. She is scheduled for next week with AT to follow up with results/plan. All questions answered.

## 2021-11-12 LAB — BETA HCG QUANT (REF LAB): hCG Quant: 12550 m[IU]/mL

## 2021-11-13 ENCOUNTER — Encounter: Payer: Self-pay | Admitting: Certified Nurse Midwife

## 2021-11-15 ENCOUNTER — Encounter: Payer: Medicaid Other | Admitting: Certified Nurse Midwife

## 2021-11-21 ENCOUNTER — Ambulatory Visit (INDEPENDENT_AMBULATORY_CARE_PROVIDER_SITE_OTHER): Payer: Medicaid Other | Admitting: Certified Nurse Midwife

## 2021-11-21 ENCOUNTER — Other Ambulatory Visit: Payer: Self-pay

## 2021-11-21 ENCOUNTER — Encounter: Payer: Self-pay | Admitting: Certified Nurse Midwife

## 2021-11-21 VITALS — BP 125/82 | HR 128 | Ht 61.0 in | Wt 144.5 lb

## 2021-11-21 DIAGNOSIS — Z3200 Encounter for pregnancy test, result unknown: Secondary | ICD-10-CM | POA: Diagnosis not present

## 2021-11-21 LAB — POCT URINE PREGNANCY: Preg Test, Ur: POSITIVE — AB

## 2021-11-21 NOTE — Progress Notes (Signed)
Subjective:    Tracy [name] is a 26 y.o. female who presents for evaluation of at home positive pregnancy test. She believes she could be pregnant. Pregnancy is not desired. Sexual Activity: single partner, contraception: IUD. Current symptoms also include: breast tenderness, nausea, and positive home pregnancy test. Last period was abnormal.   No LMP recorded (lmp unknown). Patient is pregnant. The following portions of the patient's history were reviewed and updated as appropriate: allergies, current medications, past family history, past medical history, past social history, past surgical history, and problem list.  Review of Systems Pertinent items are noted in HPI.     Objective:    BP 125/82    Pulse (!) 128    Ht 5\' 1"  (1.549 m)    Wt 144 lb 8 oz (65.5 kg)    LMP  (LMP Unknown)    BMI 27.30 kg/m  General: moderate distress and no acute distress    Lab Review Urine HCG: positive    Assessment:    Absence of menstruation.     Plan:    Pregnancy Test: Pt does not desire pregnancy. Discussed recommendation of IUD removal ASAP. Pt declines today. She will consider for removal after u/s tomorrow. Orders placed for u/s . She state she has appointment on 2/18 for abortion.   3/18, CNM

## 2021-11-22 ENCOUNTER — Emergency Department: Payer: Medicaid Other

## 2021-11-22 ENCOUNTER — Other Ambulatory Visit: Payer: Medicaid Other

## 2021-11-22 ENCOUNTER — Other Ambulatory Visit: Payer: Self-pay

## 2021-11-22 ENCOUNTER — Telehealth: Payer: Self-pay | Admitting: Certified Nurse Midwife

## 2021-11-22 DIAGNOSIS — R1030 Lower abdominal pain, unspecified: Secondary | ICD-10-CM | POA: Diagnosis not present

## 2021-11-22 DIAGNOSIS — O209 Hemorrhage in early pregnancy, unspecified: Secondary | ICD-10-CM | POA: Insufficient documentation

## 2021-11-22 DIAGNOSIS — O30041 Twin pregnancy, dichorionic/diamniotic, first trimester: Secondary | ICD-10-CM | POA: Diagnosis not present

## 2021-11-22 DIAGNOSIS — N9489 Other specified conditions associated with female genital organs and menstrual cycle: Secondary | ICD-10-CM | POA: Insufficient documentation

## 2021-11-22 DIAGNOSIS — Z3A01 Less than 8 weeks gestation of pregnancy: Secondary | ICD-10-CM | POA: Insufficient documentation

## 2021-11-22 DIAGNOSIS — O208 Other hemorrhage in early pregnancy: Secondary | ICD-10-CM | POA: Diagnosis not present

## 2021-11-22 LAB — CBC
HCT: 40 % (ref 36.0–46.0)
Hemoglobin: 13.2 g/dL (ref 12.0–15.0)
MCH: 29.1 pg (ref 26.0–34.0)
MCHC: 33 g/dL (ref 30.0–36.0)
MCV: 88.3 fL (ref 80.0–100.0)
Platelets: 343 10*3/uL (ref 150–400)
RBC: 4.53 MIL/uL (ref 3.87–5.11)
RDW: 13 % (ref 11.5–15.5)
WBC: 13.4 10*3/uL — ABNORMAL HIGH (ref 4.0–10.5)
nRBC: 0 % (ref 0.0–0.2)

## 2021-11-22 LAB — URINALYSIS, ROUTINE W REFLEX MICROSCOPIC
Bilirubin Urine: NEGATIVE
Glucose, UA: NEGATIVE mg/dL
Ketones, ur: NEGATIVE mg/dL
Leukocytes,Ua: NEGATIVE
Nitrite: NEGATIVE
Protein, ur: NEGATIVE mg/dL
Specific Gravity, Urine: 1.02 (ref 1.005–1.030)
pH: 6 (ref 5.0–8.0)

## 2021-11-22 LAB — ABO/RH: ABO/RH(D): O POS

## 2021-11-22 LAB — BASIC METABOLIC PANEL
Anion gap: 5 (ref 5–15)
BUN: 9 mg/dL (ref 6–20)
CO2: 22 mmol/L (ref 22–32)
Calcium: 9.6 mg/dL (ref 8.9–10.3)
Chloride: 104 mmol/L (ref 98–111)
Creatinine, Ser: 0.67 mg/dL (ref 0.44–1.00)
GFR, Estimated: >60 mL/min (ref >60)
Glucose, Bld: 91 mg/dL (ref 70–99)
Potassium: 4.3 mmol/L (ref 3.5–5.1)
Sodium: 131 mmol/L — ABNORMAL LOW (ref 135–145)

## 2021-11-22 LAB — POC URINE PREG, ED: Preg Test, Ur: POSITIVE — AB

## 2021-11-22 NOTE — Telephone Encounter (Signed)
Pt called stating that she wanted Tracy [name] to know that she was headed to ER - having miscarriage.

## 2021-11-22 NOTE — ED Triage Notes (Signed)
Pt presents to ER c/o vaginal bleeding that started this evening.  Pt states she was laying down and started bleeding suddenly.  Pt states she bled through 3 pads before she decided to come in tonight.  Pt states she is [redacted] weeks pregnant and is being seen [facility].  Pt states she has IUD in and is concerned d/t IUD being in place.  Pt also c/o LLQ abd pain and lower back pain with some cramping at this time.  Pt is G6P2

## 2021-11-23 ENCOUNTER — Emergency Department: Payer: Medicaid Other

## 2021-11-23 ENCOUNTER — Emergency Department
Admission: EM | Admit: 2021-11-23 | Discharge: 2021-11-23 | Disposition: A | Discharge status: Home / Self Care | Payer: Medicaid Other | Attending: Emergency Medicine | Admitting: Emergency Medicine

## 2021-11-23 DIAGNOSIS — O209 Hemorrhage in early pregnancy, unspecified: Secondary | ICD-10-CM

## 2021-11-23 DIAGNOSIS — O418X11 Other specified disorders of amniotic fluid and membranes, first trimester, fetus 1: Secondary | ICD-10-CM

## 2021-11-23 DIAGNOSIS — O208 Other hemorrhage in early pregnancy: Secondary | ICD-10-CM | POA: Diagnosis not present

## 2021-11-23 DIAGNOSIS — Z3A01 Less than 8 weeks gestation of pregnancy: Secondary | ICD-10-CM | POA: Diagnosis not present

## 2021-11-23 DIAGNOSIS — O4690 Antepartum hemorrhage, unspecified, unspecified trimester: Secondary | ICD-10-CM

## 2021-11-23 DIAGNOSIS — O30041 Twin pregnancy, dichorionic/diamniotic, first trimester: Secondary | ICD-10-CM | POA: Diagnosis not present

## 2021-11-23 LAB — HCG, QUANTITATIVE, PREGNANCY: hCG, Beta Chain, Quant, S: 105197 m[IU]/mL — ABNORMAL HIGH (ref <5)

## 2021-11-23 NOTE — ED Notes (Signed)
Pt discharge information reviewed. Pt understands need for follow up care and when to return if symptoms worsen. All questions answered. Pt is alert and oriented with even and regular respirations. Pt is seen ambulating out of department with string steady gait.   

## 2021-11-23 NOTE — ED Provider Notes (Signed)
[facility] Provider Note    Event Date/Time   First MD Initiated Contact with Patient 11/23/21 [phone]     (approximate)   History   Vaginal Bleeding   HPI  Tracy [name] is a 26 y.o. female who presents to the ED for evaluation of Vaginal Bleeding   I reviewed OB/GYN visit from 2/13.  Sounds like unwanted gestation and she reportedly has an appointment for abortion already scheduled on 2/18. G5, P2 A2. Rh positive.   Patient presents to the ED for evaluation of vaginal bleeding.  She reports concern explicitly for malpositioned or complication from her copper IUD that remains despite her pregnancy.  She presents with her mother-in-law who provides some supplemental history.  She reports developing lower abdominal cramping diffusely and vaginal bleeding this evening, going through 3 pads and having a couple small clots as well.  Since that she has been here, waiting for about 4 hours prior to my evaluation, she has had no bleeding and reports her cramping is little bit better.  She does report having an upcoming abortion at [facility] this coming Saturday on 2/18.  Reports she has a copper IUD in place that is only been present for about 4 months.   Denies syncopal episodes, dyspnea on exertion, chest pain, falls or trauma.  Denies other bleeding diatheses.  Physical Exam   Triage Vital Signs: ED Triage Vitals  Enc Vitals Group     BP 11/22/21 2258 (!) 127/40     Pulse Rate 11/22/21 2258 98     Resp 11/22/21 2258 20     Temp 11/22/21 2258 98.1 F (36.7 C)     Temp Source 11/22/21 2258 Oral     SpO2 11/22/21 2258 100 %     Weight --      Height --      Head Circumference --      Peak Flow --      Pain Score 11/22/21 2307 7     Pain Loc --      Pain Edu? --      Excl. in GC? --     Most recent vital signs: Vitals:   11/22/21 2258  BP: (!) 127/40  Pulse: 98  Resp: 20  Temp: 98.1 F (36.7 C)  SpO2: 100%    General: Awake, no  distress.  Pleasant and conversational in full sentences.  Appropriately emotional when discussing possible abortion, twins CV:  Good peripheral perfusion. RRR Resp:  Normal effort.  Abd:  No distention.  Soft and benign throughout MSK:  No deformity noted.  Neuro:  No focal deficits appreciated. Other:     ED Results / Procedures / Treatments   Labs (all labs ordered are listed, but only abnormal results are displayed) Labs Reviewed  CBC - Abnormal; Notable for the following components:      Result Value   WBC 13.4 (*)    All other components within normal limits  BASIC METABOLIC PANEL - Abnormal; Notable for the following components:   Sodium 131 (*)    All other components within normal limits  HCG, QUANTITATIVE, PREGNANCY - Abnormal; Notable for the following components:   hCG, Beta Chain, Quant, S 105,197 (*)    All other components within normal limits  URINALYSIS, ROUTINE W REFLEX MICROSCOPIC - Abnormal; Notable for the following components:   Color, Urine YELLOW (*)    APPearance HAZY (*)    Hgb urine dipstick MODERATE (*)  Bacteria, UA RARE (*)    All other components within normal limits  POC URINE PREG, ED - Abnormal; Notable for the following components:   Preg Test, Ur Positive (*)    All other components within normal limits  ABO/RH    EKG   RADIOLOGY Obstetric ultrasound reviewed by me with twin gestations  Official radiology report(s): US OB Comp AddL Gest Less 14 Wks  Result Date: 11/23/2021 CLINICAL DATA:  Vaginal bleeding. Pregnant. Quantitative beta HCG 105, 197. copper IUD. EXAM: TWIN OBSTETRIC <14WK [place] AND TRANSVAGINAL OB US TECHNIQUE: Both transabdominal and transvaginal ultrasound examinations were performed for complete evaluation of the gestation as well as the maternal uterus, adnexal regions, and pelvic cul-de-sac. Transvaginal technique was performed to assess early pregnancy. COMPARISON:  None. FINDINGS: Number of IUPs:  2  Chorionicity/Amnionicity:  Dichorionic-diamniotic (thick membrane) TWIN 1 Yolk sac:  Visualized. Embryo:  Visualized. Cardiac Activity: Visualized. Heart Rate: 153 bpm CRL:  12.1 mm   7 w 3 d                  [place] EDC: 07/08/2022 TWIN 2 Yolk sac:  Visualized. Embryo:  Visualized. Cardiac Activity: Visualized. Heart Rate: 107 bpm CRL:  5.8 mm   6 w 3 d                  [place] EDC: 07/15/2022 Subchorionic hemorrhage: Small subchorionic hemorrhage along twin A. Maternal uterus/adnexae: T shaped intrauterine device noted within the endometrium adjacent to twin A gestational sac. Bilateral ovaries are unremarkable. There is a corpus luteum cyst is noted within the right ovary. The uterus is otherwise unremarkable. Other: No free fluid within the pelvis. IMPRESSION: 1. Live twin pregnancy with gestational age of twin A of 7 weeks and 3 days and gestational age of twin B of 6 weeks and 3 days. 2. A t-shaped intrauterine device is noted within the endometrium. 3. Small subchorionic hemorrhage. Electronically Signed   By: Tish Frederickson M.D.   On: 11/23/2021 01:55   US OB LESS THAN 14 WEEKS WITH OB TRANSVAGINAL  Result Date: 11/23/2021 CLINICAL DATA:  Vaginal bleeding. Pregnant. Quantitative beta HCG 105, 197. copper IUD. EXAM: TWIN OBSTETRIC <14WK [place] AND TRANSVAGINAL OB US TECHNIQUE: Both transabdominal and transvaginal ultrasound examinations were performed for complete evaluation of the gestation as well as the maternal uterus, adnexal regions, and pelvic cul-de-sac. Transvaginal technique was performed to assess early pregnancy. COMPARISON:  None. FINDINGS: Number of IUPs:  2 Chorionicity/Amnionicity:  Dichorionic-diamniotic (thick membrane) TWIN 1 Yolk sac:  Visualized. Embryo:  Visualized. Cardiac Activity: Visualized. Heart Rate: 153 bpm CRL:  12.1 mm   7 w 3 d                  [place] EDC: 07/08/2022 TWIN 2 Yolk sac:  Visualized. Embryo:  Visualized. Cardiac Activity: Visualized. Heart Rate: 107 bpm CRL:  5.8 mm   6 w 3 d                   [place] EDC: 07/15/2022 Subchorionic hemorrhage: Small subchorionic hemorrhage along twin A. Maternal uterus/adnexae: T shaped intrauterine device noted within the endometrium adjacent to twin A gestational sac. Bilateral ovaries are unremarkable. There is a corpus luteum cyst is noted within the right ovary. The uterus is otherwise unremarkable. Other: No free fluid within the pelvis. IMPRESSION: 1. Live twin pregnancy with gestational age of twin A of 7 weeks and 3 days and gestational age of twin B of  6 weeks and 3 days. 2. A t-shaped intrauterine device is noted within the endometrium. 3. Small subchorionic hemorrhage. Electronically Signed   By: Tish Frederickson M.D.   On: 11/23/2021 01:55    PROCEDURES and INTERVENTIONS:  Procedures  Medications - No data to display   IMPRESSION / MDM / ASSESSMENT AND PLAN / ED COURSE  I reviewed the triage vital signs and the nursing notes.  Pleasant 26 year old female presents to the ED with vaginal bleeding and twin for trimester pregnancy in the setting of an IUD in place, possibly a spontaneous abortion in progress and suitable for outpatient management with close OB/GYN follow-up.  She looks clinically well and has no evidence of continued bleeding or decompensated blood loss anemia.  No tachycardia and her hemoglobin is normal.  Urine is noninfectious and BMP is normal.  Ultrasound confirms intrauterine twin gestation, in conjunction with a copper IUD in place.  Small subchorionic hemorrhage is noted and relatively slow heart rate noted for one of the fetuses.   I discussed the patient the fairly poor prognostic combination of vaginal bleeding, subchorionic hemorrhage and slow fetal heart rate.  I offered a pelvic examination to assess her cervical eyes being open or closed out distinguish this type of possible abortion if it is just threatened or inevitable, but she declines.  We talked for little while about her initially being "on the fence"  about abortion because she has emotional attachment to twin gestations as she is a twin herself, but she ultimately says that she plans to move forward with the abortion this weekend.  We discussed what to expect at home, reasons to return to the ED and appropriate follow-up with OB/GYN.  We discussed symptom management at home of her cramping and what it may look like at home if she were to miscarry 1 or both of the fetuses.  She does not require RhoGAM as she is Rh+ and I see no barriers to outpatient management at this point.  Clinical Course as of 11/23/21 0333  Wed Nov 23, 2021  0310 Long discussion about plan of care and options [DS]    Clinical Course User Index [DS] Delton Prairie, MD     FINAL CLINICAL IMPRESSION(S) / ED DIAGNOSES   Final diagnoses:  Vaginal bleeding affecting early pregnancy     Rx / DC Orders   ED Discharge Orders     None        Note:  This document was prepared using Dragon voice recognition software and may include unintentional dictation errors.   Delton Prairie, MD 11/23/21 [phone]

## 2021-11-23 NOTE — ED Notes (Signed)
ED Provider at bedside. 

## 2021-11-23 NOTE — Discharge Instructions (Signed)
Use Tylenol for pain and fevers.  Up to 1000 mg per dose, up to 4 times per day.  Do not take more than 4000 mg of Tylenol/acetaminophen within 24 hours..  Follow[facility] with [facility].  Reach out to Dr. Marcelline Mates to discuss tubal ligation.   If you develop any passing out all the way, severely worsening abdominal pain, uncontrolled bleeding or other worsening symptoms then please return to the ED.

## 2021-11-23 NOTE — ED Notes (Signed)
Pt given Ice water

## 2021-11-24 ENCOUNTER — Telehealth: Payer: Self-pay

## 2021-11-24 NOTE — Telephone Encounter (Signed)
Transition Care Management Unsuccessful Follow-up Telephone Call  Date of discharge and from where:  11/23/2021-[facility]  Attempts:  1st Attempt  Reason for unsuccessful TCM follow-up call:  Unable to reach patient

## 2021-11-25 NOTE — Telephone Encounter (Signed)
Transition Care Management Unsuccessful Follow-up Telephone Call  Date of discharge and from where:  11/23/2021-[facility]  Attempts:  2nd Attempt  Reason for unsuccessful TCM follow-up call:  Unable to reach patient

## 2021-11-26 DIAGNOSIS — Z30433 Encounter for removal and reinsertion of intrauterine contraceptive device: Secondary | ICD-10-CM | POA: Diagnosis not present

## 2021-11-28 NOTE — Telephone Encounter (Signed)
Transition Care Management Unsuccessful Follow-up Telephone Call  Date of discharge and from where:  11/23/2021 from [facility]  Attempts:  3rd Attempt  Reason for unsuccessful TCM follow-up call:  Unable to reach patient

## 2021-12-07 ENCOUNTER — Telehealth: Payer: Self-pay | Admitting: Certified Nurse Midwife

## 2021-12-07 ENCOUNTER — Encounter: Payer: Self-pay | Admitting: Certified Nurse Midwife

## 2021-12-07 DIAGNOSIS — Z419 Encounter for procedure for purposes other than remedying health state, unspecified: Secondary | ICD-10-CM | POA: Diagnosis not present

## 2021-12-07 NOTE — Telephone Encounter (Signed)
Pt called and stated that she can feel her strings from her IUD with her fingers. Pt stated that she wasn't able to before and wanted to know if this is normal or should she schedule an appt to be seen. Please advise.  ?

## 2021-12-08 ENCOUNTER — Encounter: Payer: Medicaid Other | Admitting: Obstetrics

## 2021-12-14 ENCOUNTER — Encounter: Payer: Medicaid Other | Admitting: Obstetrics

## 2022-01-07 DIAGNOSIS — Z419 Encounter for procedure for purposes other than remedying health state, unspecified: Secondary | ICD-10-CM | POA: Diagnosis not present

## 2022-02-06 DIAGNOSIS — Z419 Encounter for procedure for purposes other than remedying health state, unspecified: Secondary | ICD-10-CM | POA: Diagnosis not present

## 2022-03-09 DIAGNOSIS — Z419 Encounter for procedure for purposes other than remedying health state, unspecified: Secondary | ICD-10-CM | POA: Diagnosis not present

## 2022-04-08 DIAGNOSIS — Z419 Encounter for procedure for purposes other than remedying health state, unspecified: Secondary | ICD-10-CM | POA: Diagnosis not present

## 2022-05-09 DIAGNOSIS — Z419 Encounter for procedure for purposes other than remedying health state, unspecified: Secondary | ICD-10-CM | POA: Diagnosis not present

## 2022-06-09 DIAGNOSIS — Z419 Encounter for procedure for purposes other than remedying health state, unspecified: Secondary | ICD-10-CM | POA: Diagnosis not present

## 2022-07-09 DIAGNOSIS — Z419 Encounter for procedure for purposes other than remedying health state, unspecified: Secondary | ICD-10-CM | POA: Diagnosis not present

## 2022-07-25 ENCOUNTER — Encounter: Payer: Self-pay | Admitting: Certified Nurse Midwife

## 2022-08-09 DIAGNOSIS — Z419 Encounter for procedure for purposes other than remedying health state, unspecified: Secondary | ICD-10-CM | POA: Diagnosis not present

## 2022-09-08 DIAGNOSIS — Z419 Encounter for procedure for purposes other than remedying health state, unspecified: Secondary | ICD-10-CM | POA: Diagnosis not present

## 2022-10-09 DIAGNOSIS — Z419 Encounter for procedure for purposes other than remedying health state, unspecified: Secondary | ICD-10-CM | POA: Diagnosis not present

## 2022-11-09 DIAGNOSIS — Z419 Encounter for procedure for purposes other than remedying health state, unspecified: Secondary | ICD-10-CM | POA: Diagnosis not present

## 2022-12-08 DIAGNOSIS — Z419 Encounter for procedure for purposes other than remedying health state, unspecified: Secondary | ICD-10-CM | POA: Diagnosis not present

## 2023-01-08 DIAGNOSIS — Z419 Encounter for procedure for purposes other than remedying health state, unspecified: Secondary | ICD-10-CM | POA: Diagnosis not present

## 2023-02-07 DIAGNOSIS — Z419 Encounter for procedure for purposes other than remedying health state, unspecified: Secondary | ICD-10-CM | POA: Diagnosis not present

## 2023-03-10 DIAGNOSIS — Z419 Encounter for procedure for purposes other than remedying health state, unspecified: Secondary | ICD-10-CM | POA: Diagnosis not present

## 2023-04-09 DIAGNOSIS — Z419 Encounter for procedure for purposes other than remedying health state, unspecified: Secondary | ICD-10-CM | POA: Diagnosis not present

## 2023-04-18 ENCOUNTER — Telehealth: Payer: Self-pay

## 2023-04-18 NOTE — Telephone Encounter (Signed)
Mychart message sent to patient to set up with PCP as she currently does not have one listed. Elijio Miles [facility]

## 2023-05-10 DIAGNOSIS — Z419 Encounter for procedure for purposes other than remedying health state, unspecified: Secondary | ICD-10-CM | POA: Diagnosis not present

## 2023-06-10 DIAGNOSIS — Z419 Encounter for procedure for purposes other than remedying health state, unspecified: Secondary | ICD-10-CM | POA: Diagnosis not present

## 2023-07-10 DIAGNOSIS — Z419 Encounter for procedure for purposes other than remedying health state, unspecified: Secondary | ICD-10-CM | POA: Diagnosis not present

## 2023-08-10 DIAGNOSIS — Z419 Encounter for procedure for purposes other than remedying health state, unspecified: Secondary | ICD-10-CM | POA: Diagnosis not present

## 2023-09-09 DIAGNOSIS — Z419 Encounter for procedure for purposes other than remedying health state, unspecified: Secondary | ICD-10-CM | POA: Diagnosis not present

## 2023-10-10 DIAGNOSIS — Z419 Encounter for procedure for purposes other than remedying health state, unspecified: Secondary | ICD-10-CM | POA: Diagnosis not present

## 2023-11-10 DIAGNOSIS — Z419 Encounter for procedure for purposes other than remedying health state, unspecified: Secondary | ICD-10-CM | POA: Diagnosis not present

## 2023-12-08 DIAGNOSIS — Z419 Encounter for procedure for purposes other than remedying health state, unspecified: Secondary | ICD-10-CM | POA: Diagnosis not present

## 2023-12-10 IMAGING — US US OB < 14 WEEKS - US OB TV
1 series · 13 of 28 positions shown · non-contrast
Comparison: None.

CLINICAL DATA: Vaginal bleeding. Pregnant. Quantitative beta HCG
105, 197. [place] IUD.

EXAM:
TWIN OBSTETRIC <14WK US AND TRANSVAGINAL OB US
TECHNIQUE: Both transabdominal and transvaginal ultrasound examinations were
performed for complete evaluation of the gestation as well as the
maternal uterus, adnexal regions, and pelvic cul-de-sac.
Transvaginal technique was performed to assess early pregnancy.

[Series 1: early ob us · arterial · 118 acquisitions, 13 frames shown]
[im 5/118]
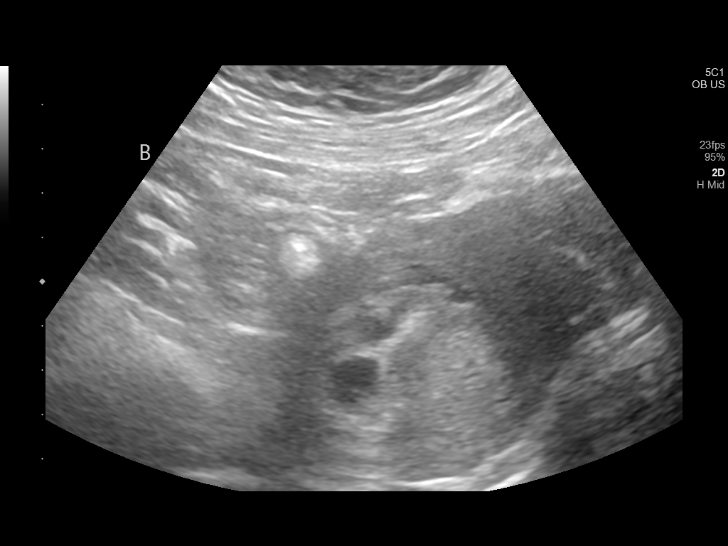
[im 14/118]
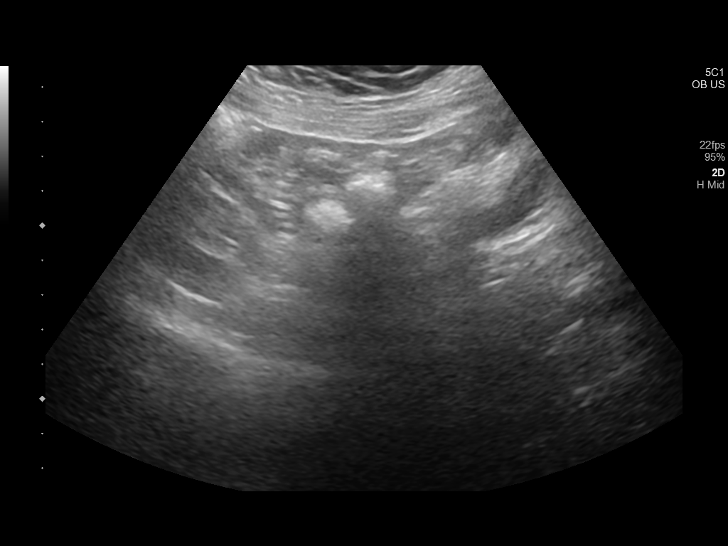
[im 22/118]
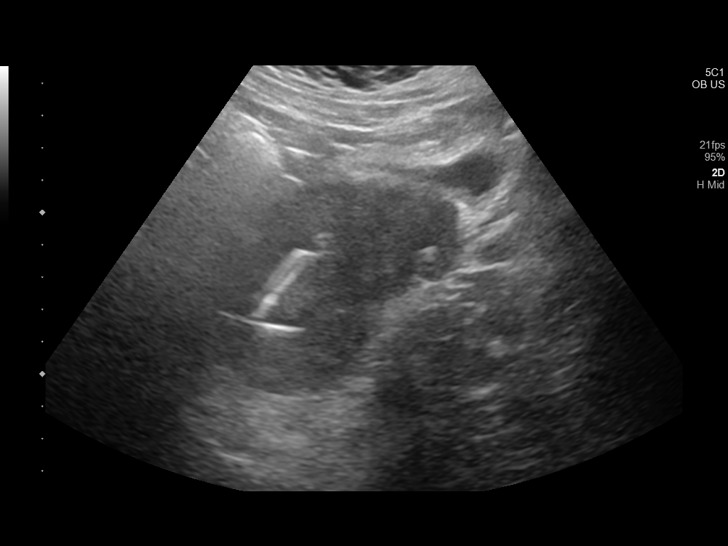
[im 31/118]
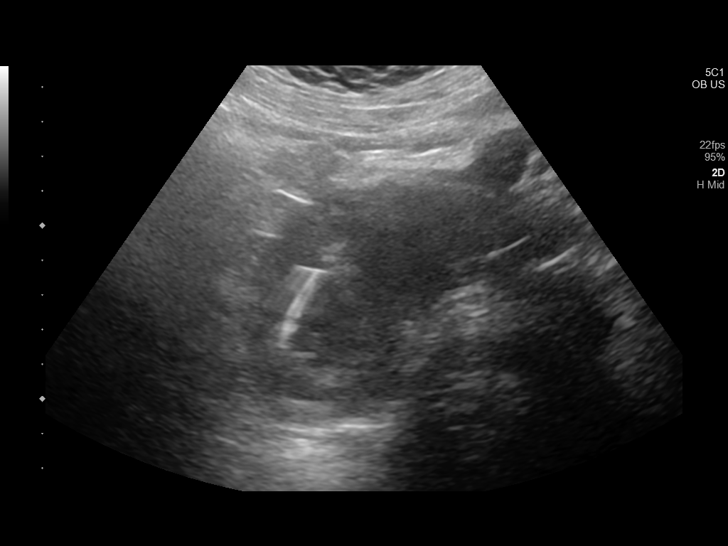
[im 40/118]
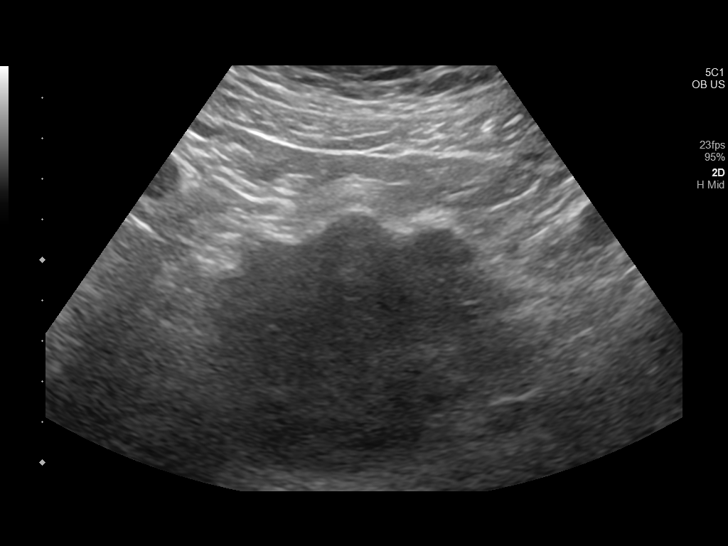
[im 48/118]
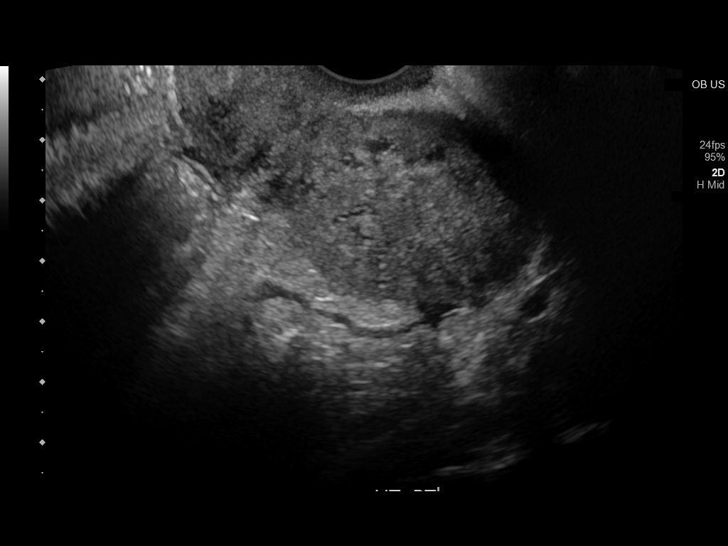
[im 61/118]
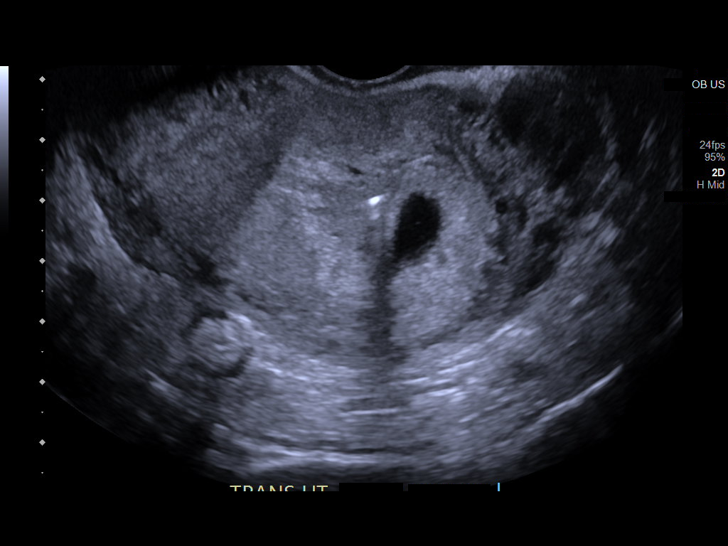
[im 70/118]
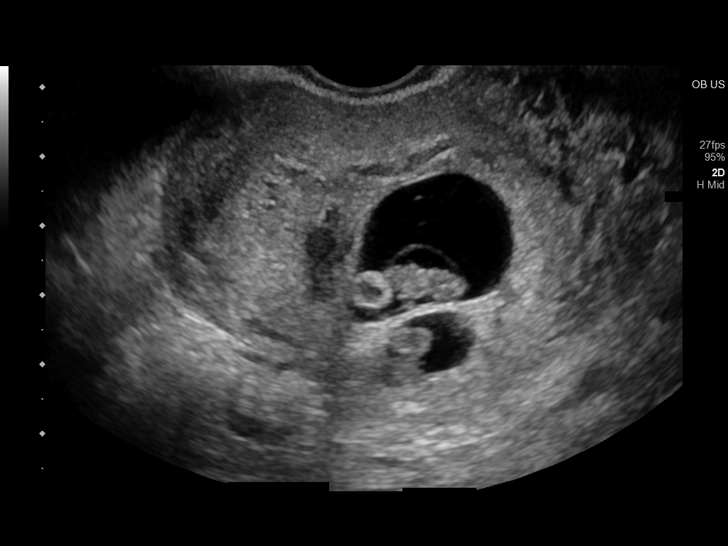
[im 79/118]
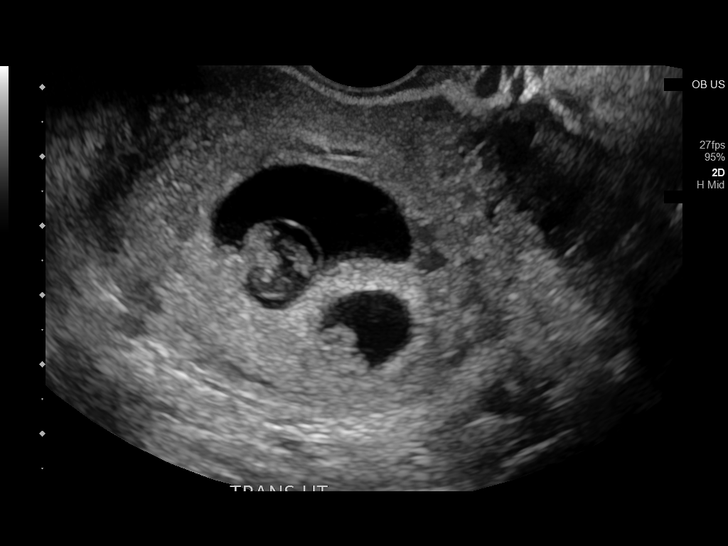
[im 87/118]
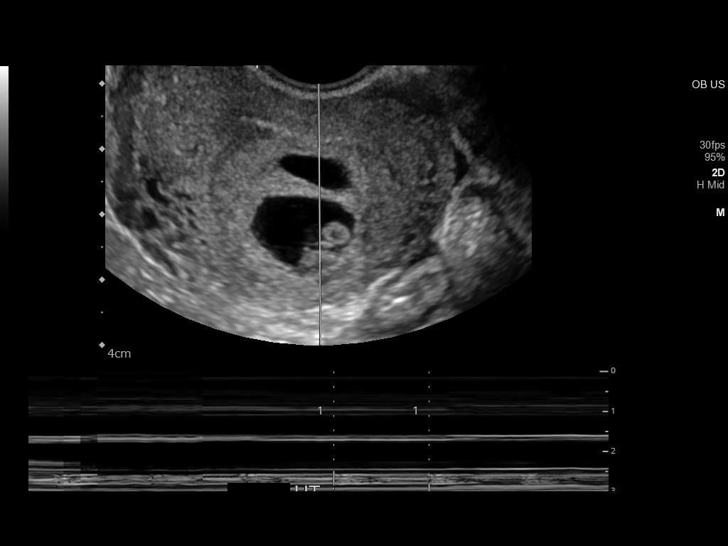
[im 96/118]
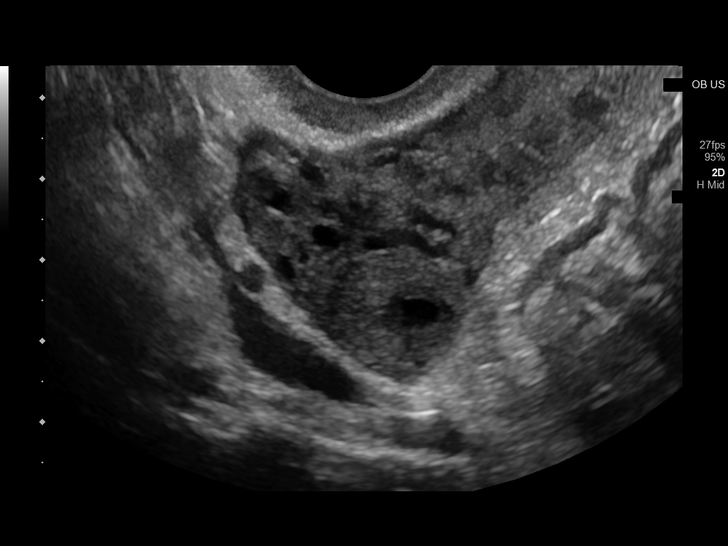
[im 105/118]
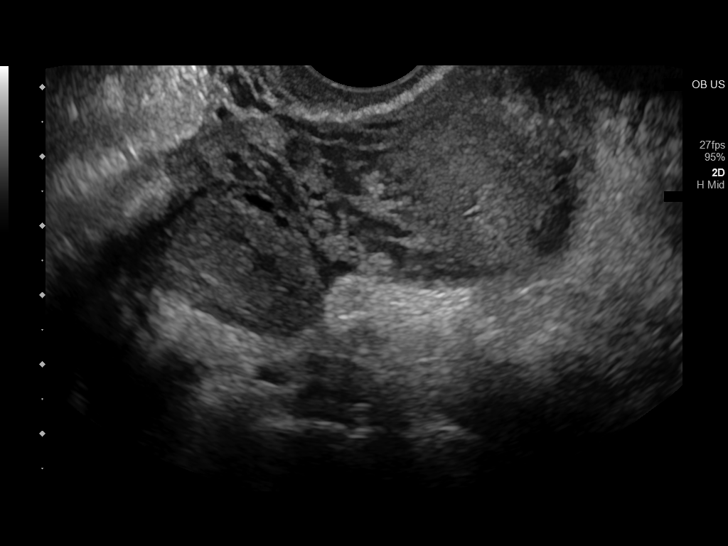
[im 113/118]
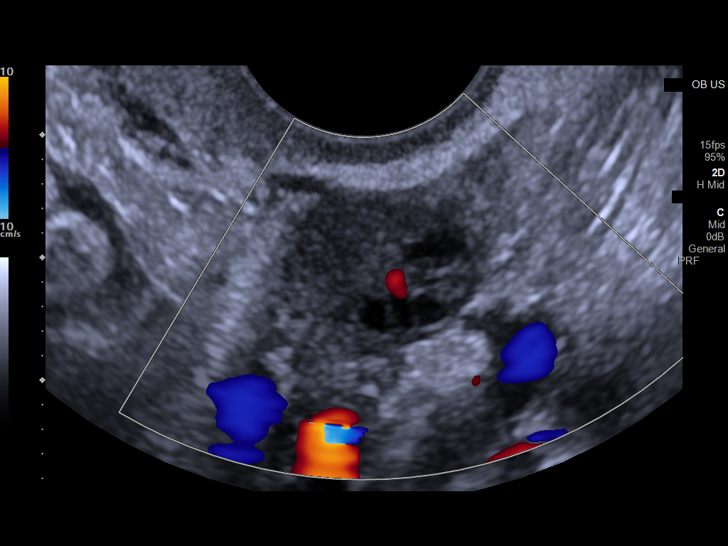

[13 of 28 positions shown; findings below may reference images not displayed]

FINDINGS: Number of IUPs:  2

Chorionicity/Amnionicity:  Dichorionic-diamniotic (thick membrane)

TWIN 1

Yolk sac:  Visualized.

Embryo:  Visualized.

Cardiac Activity: Visualized.

Heart Rate: 153 bpm

CRL:  12.1 mm   7 w 3 d                  US EDC: 07/08/2022

TWIN 2

Yolk sac:  Visualized.

Embryo:  Visualized.

Cardiac Activity: Visualized.

Heart Rate: 107 bpm

CRL:  5.8 mm   6 w 3 d                  US EDC: 07/15/2022

Subchorionic hemorrhage: Small subchorionic hemorrhage along twin A.

Maternal uterus/adnexae: T shaped intrauterine device noted within
the endometrium adjacent to twin A gestational sac. Bilateral
ovaries are unremarkable. There is a corpus luteum cyst is noted
within the right ovary. The uterus is otherwise unremarkable.

Other: No free fluid within the pelvis.
IMPRESSION: 1. Live twin pregnancy with gestational age of twin A of 7 weeks and
3 days and gestational age of twin B of 6 weeks and 3 days.
2. A t-shaped intrauterine device is noted within the endometrium.
3. Small subchorionic hemorrhage.

## 2024-01-19 DIAGNOSIS — Z419 Encounter for procedure for purposes other than remedying health state, unspecified: Secondary | ICD-10-CM | POA: Diagnosis not present

## 2024-02-18 DIAGNOSIS — Z419 Encounter for procedure for purposes other than remedying health state, unspecified: Secondary | ICD-10-CM | POA: Diagnosis not present

## 2024-03-20 DIAGNOSIS — Z419 Encounter for procedure for purposes other than remedying health state, unspecified: Secondary | ICD-10-CM | POA: Diagnosis not present

## 2024-04-19 DIAGNOSIS — Z419 Encounter for procedure for purposes other than remedying health state, unspecified: Secondary | ICD-10-CM | POA: Diagnosis not present

## 2024-05-20 DIAGNOSIS — Z419 Encounter for procedure for purposes other than remedying health state, unspecified: Secondary | ICD-10-CM | POA: Diagnosis not present

## 2024-06-20 DIAGNOSIS — Z419 Encounter for procedure for purposes other than remedying health state, unspecified: Secondary | ICD-10-CM | POA: Diagnosis not present

## 2024-07-20 DIAGNOSIS — Z419 Encounter for procedure for purposes other than remedying health state, unspecified: Secondary | ICD-10-CM | POA: Diagnosis not present
